# Patient Record
Sex: Female | Born: 1945
Health system: Southern US, Community
[De-identification: ages and names within clinical notes are randomized; demographics above are authoritative.]

## PROBLEM LIST (undated history)

## (undated) DIAGNOSIS — I4719 Other supraventricular tachycardia: Secondary | ICD-10-CM

## (undated) DIAGNOSIS — R002 Palpitations: Secondary | ICD-10-CM

## (undated) DIAGNOSIS — I10 Essential (primary) hypertension: Secondary | ICD-10-CM

## (undated) DIAGNOSIS — I471 Supraventricular tachycardia, unspecified: Secondary | ICD-10-CM

## (undated) DIAGNOSIS — E7800 Pure hypercholesterolemia, unspecified: Secondary | ICD-10-CM

## (undated) HISTORY — PX: TUBAL LIGATION: SHX77

## (undated) HISTORY — DX: Palpitations: R00.2

## (undated) HISTORY — PX: TONSILLECTOMY: SUR1361

## (undated) HISTORY — DX: Supraventricular tachycardia, unspecified: I47.10

## (undated) HISTORY — DX: Other supraventricular tachycardia: I47.19

## (undated) HISTORY — DX: Pure hypercholesterolemia, unspecified: E78.00

---

## 1997-08-22 ENCOUNTER — Other Ambulatory Visit: Admission: RE | Admit: 1997-08-22 | Discharge: 1997-08-22 | Payer: Self-pay | Admitting: Obstetrics & Gynecology

## 1999-10-20 ENCOUNTER — Other Ambulatory Visit: Admission: RE | Admit: 1999-10-20 | Discharge: 1999-10-20 | Payer: Self-pay | Admitting: Obstetrics & Gynecology

## 2002-03-04 ENCOUNTER — Other Ambulatory Visit: Admission: RE | Admit: 2002-03-04 | Discharge: 2002-03-04 | Payer: Self-pay | Admitting: Obstetrics & Gynecology

## 2003-11-06 ENCOUNTER — Other Ambulatory Visit: Admission: RE | Admit: 2003-11-06 | Discharge: 2003-11-06 | Payer: Self-pay | Admitting: Obstetrics & Gynecology

## 2005-03-21 ENCOUNTER — Other Ambulatory Visit: Admission: RE | Admit: 2005-03-21 | Discharge: 2005-03-21 | Payer: Self-pay | Admitting: Emergency Medicine

## 2006-05-04 ENCOUNTER — Other Ambulatory Visit: Admission: RE | Admit: 2006-05-04 | Discharge: 2006-05-04 | Payer: Self-pay | Admitting: Emergency Medicine

## 2010-02-07 ENCOUNTER — Encounter: Payer: Self-pay | Admitting: Emergency Medicine

## 2011-01-04 ENCOUNTER — Ambulatory Visit: Payer: Self-pay

## 2012-05-18 DIAGNOSIS — R209 Unspecified disturbances of skin sensation: Secondary | ICD-10-CM | POA: Diagnosis not present

## 2012-05-18 DIAGNOSIS — M999 Biomechanical lesion, unspecified: Secondary | ICD-10-CM | POA: Diagnosis not present

## 2012-05-18 DIAGNOSIS — M9981 Other biomechanical lesions of cervical region: Secondary | ICD-10-CM | POA: Diagnosis not present

## 2012-05-18 DIAGNOSIS — M531 Cervicobrachial syndrome: Secondary | ICD-10-CM | POA: Diagnosis not present

## 2012-06-05 DIAGNOSIS — M999 Biomechanical lesion, unspecified: Secondary | ICD-10-CM | POA: Diagnosis not present

## 2012-06-05 DIAGNOSIS — M9981 Other biomechanical lesions of cervical region: Secondary | ICD-10-CM | POA: Diagnosis not present

## 2012-06-05 DIAGNOSIS — M531 Cervicobrachial syndrome: Secondary | ICD-10-CM | POA: Diagnosis not present

## 2012-06-05 DIAGNOSIS — R209 Unspecified disturbances of skin sensation: Secondary | ICD-10-CM | POA: Diagnosis not present

## 2012-07-09 DIAGNOSIS — M999 Biomechanical lesion, unspecified: Secondary | ICD-10-CM | POA: Diagnosis not present

## 2012-07-09 DIAGNOSIS — R209 Unspecified disturbances of skin sensation: Secondary | ICD-10-CM | POA: Diagnosis not present

## 2012-07-09 DIAGNOSIS — M531 Cervicobrachial syndrome: Secondary | ICD-10-CM | POA: Diagnosis not present

## 2012-07-09 DIAGNOSIS — M9981 Other biomechanical lesions of cervical region: Secondary | ICD-10-CM | POA: Diagnosis not present

## 2012-08-07 DIAGNOSIS — M999 Biomechanical lesion, unspecified: Secondary | ICD-10-CM | POA: Diagnosis not present

## 2012-08-07 DIAGNOSIS — R209 Unspecified disturbances of skin sensation: Secondary | ICD-10-CM | POA: Diagnosis not present

## 2012-08-07 DIAGNOSIS — M531 Cervicobrachial syndrome: Secondary | ICD-10-CM | POA: Diagnosis not present

## 2012-08-07 DIAGNOSIS — M9981 Other biomechanical lesions of cervical region: Secondary | ICD-10-CM | POA: Diagnosis not present

## 2012-09-07 DIAGNOSIS — R209 Unspecified disturbances of skin sensation: Secondary | ICD-10-CM | POA: Diagnosis not present

## 2012-09-07 DIAGNOSIS — M9981 Other biomechanical lesions of cervical region: Secondary | ICD-10-CM | POA: Diagnosis not present

## 2012-09-07 DIAGNOSIS — M999 Biomechanical lesion, unspecified: Secondary | ICD-10-CM | POA: Diagnosis not present

## 2012-09-07 DIAGNOSIS — M531 Cervicobrachial syndrome: Secondary | ICD-10-CM | POA: Diagnosis not present

## 2012-09-24 DIAGNOSIS — M999 Biomechanical lesion, unspecified: Secondary | ICD-10-CM | POA: Diagnosis not present

## 2012-09-24 DIAGNOSIS — R209 Unspecified disturbances of skin sensation: Secondary | ICD-10-CM | POA: Diagnosis not present

## 2012-09-24 DIAGNOSIS — M9981 Other biomechanical lesions of cervical region: Secondary | ICD-10-CM | POA: Diagnosis not present

## 2012-09-24 DIAGNOSIS — M531 Cervicobrachial syndrome: Secondary | ICD-10-CM | POA: Diagnosis not present

## 2012-10-17 DIAGNOSIS — L821 Other seborrheic keratosis: Secondary | ICD-10-CM | POA: Diagnosis not present

## 2012-10-17 DIAGNOSIS — Z23 Encounter for immunization: Secondary | ICD-10-CM | POA: Diagnosis not present

## 2012-10-17 DIAGNOSIS — H579 Unspecified disorder of eye and adnexa: Secondary | ICD-10-CM | POA: Diagnosis not present

## 2012-10-25 DIAGNOSIS — M999 Biomechanical lesion, unspecified: Secondary | ICD-10-CM | POA: Diagnosis not present

## 2012-10-25 DIAGNOSIS — M531 Cervicobrachial syndrome: Secondary | ICD-10-CM | POA: Diagnosis not present

## 2012-10-25 DIAGNOSIS — R209 Unspecified disturbances of skin sensation: Secondary | ICD-10-CM | POA: Diagnosis not present

## 2012-10-25 DIAGNOSIS — M9981 Other biomechanical lesions of cervical region: Secondary | ICD-10-CM | POA: Diagnosis not present

## 2012-12-18 DIAGNOSIS — R209 Unspecified disturbances of skin sensation: Secondary | ICD-10-CM | POA: Diagnosis not present

## 2012-12-18 DIAGNOSIS — M999 Biomechanical lesion, unspecified: Secondary | ICD-10-CM | POA: Diagnosis not present

## 2012-12-18 DIAGNOSIS — M9981 Other biomechanical lesions of cervical region: Secondary | ICD-10-CM | POA: Diagnosis not present

## 2012-12-18 DIAGNOSIS — M531 Cervicobrachial syndrome: Secondary | ICD-10-CM | POA: Diagnosis not present

## 2012-12-20 DIAGNOSIS — M9981 Other biomechanical lesions of cervical region: Secondary | ICD-10-CM | POA: Diagnosis not present

## 2012-12-20 DIAGNOSIS — M531 Cervicobrachial syndrome: Secondary | ICD-10-CM | POA: Diagnosis not present

## 2012-12-20 DIAGNOSIS — M999 Biomechanical lesion, unspecified: Secondary | ICD-10-CM | POA: Diagnosis not present

## 2012-12-20 DIAGNOSIS — R209 Unspecified disturbances of skin sensation: Secondary | ICD-10-CM | POA: Diagnosis not present

## 2012-12-27 DIAGNOSIS — M999 Biomechanical lesion, unspecified: Secondary | ICD-10-CM | POA: Diagnosis not present

## 2012-12-27 DIAGNOSIS — M531 Cervicobrachial syndrome: Secondary | ICD-10-CM | POA: Diagnosis not present

## 2012-12-27 DIAGNOSIS — R209 Unspecified disturbances of skin sensation: Secondary | ICD-10-CM | POA: Diagnosis not present

## 2012-12-27 DIAGNOSIS — M9981 Other biomechanical lesions of cervical region: Secondary | ICD-10-CM | POA: Diagnosis not present

## 2013-01-04 ENCOUNTER — Encounter: Payer: Self-pay | Admitting: Cardiovascular Disease

## 2013-01-04 ENCOUNTER — Ambulatory Visit (INDEPENDENT_AMBULATORY_CARE_PROVIDER_SITE_OTHER): Payer: Medicare Other | Admitting: Cardiovascular Disease

## 2013-01-04 VITALS — BP 130/90 | HR 66 | Ht 61.5 in | Wt 128.0 lb

## 2013-01-04 DIAGNOSIS — R002 Palpitations: Secondary | ICD-10-CM | POA: Diagnosis not present

## 2013-01-04 NOTE — Assessment & Plan Note (Signed)
Patient was a long-time patient of Dr. Caprice Kluver. He saw her for palpitations. She had a negative Myoview back in 2005. She gets palpitations during stress and with chocolate and caffeine. She lost her husband of 46 years this past year and has had more palpitations than usual. She denies chest pain or shortness of breath

## 2013-01-04 NOTE — Progress Notes (Signed)
     01/04/2013 Kristen Merritt   November 25, 1945  [id number]  Primary Physician Kristen Bussing, MD Primary Cardiologist: Runell Gess MD [name]   HPI:  Kristen Merritt is a playful 66 year old thin appearing recently widowed Caucasian female (husband of 46 years died within the last year), mother of 3 (accompanied by one of her daughters today), grandmother 4 grandchildren who was formerly a patient of Dr. Mindi Junker [facility]. She has a history of palpitations exacerbated by ingestion of caffeine and/or chocolate. He is also  On exacerbated by stress. With the recent loss of her husband of 46 years she's been under excessive stress and has noticed no palpitations. She denies chest pain or shortness of breath. She's had acceptable lipid profile in the past which is followed by her primary care physician. She denies chest pain or shortness of breath.   Current Outpatient Prescriptions  Medication Sig Dispense Refill  . atenolol (TENORMIN) 25 MG tablet Take 25 mg by mouth daily.       No current facility-administered medications for this visit.    Allergies  Allergen Reactions  . Lodine [Etodolac]     rash  . Mavik [Trandolapril]   . Sulfa Antibiotics     Rash     History   Social History  . Marital Status: Single    Spouse Name: N/A    Number of Children: N/A  . Years of Education: N/A   Occupational History  . Not on file.   Social History Main Topics  . Smoking status: Never Smoker   . Smokeless tobacco: Not on file  . Alcohol Use: Not on file  . Drug Use: Not on file  . Sexual Activity: Not on file   Other Topics Concern  . Not on file   Social History Narrative  . No narrative on file     Review of Systems: General: negative for chills, fever, night sweats or weight changes.  Cardiovascular: negative for chest pain, dyspnea on exertion, edema, orthopnea, palpitations, paroxysmal nocturnal dyspnea or shortness of breath Dermatological: negative for  rash Respiratory: negative for cough or wheezing Urologic: negative for hematuria Abdominal: negative for nausea, vomiting, diarrhea, bright red blood per rectum, melena, or hematemesis Neurologic: negative for visual changes, syncope, or dizziness All other systems reviewed and are otherwise negative except as noted above.    Blood pressure 130/70, pulse 66, height 5' 1.5" (1.562 m), weight 128 lb (58.06 kg).  General appearance: alert and no distress Neck: no adenopathy, no carotid bruit, no JVD, supple, symmetrical, trachea midline and thyroid not enlarged, symmetric, no tenderness/mass/nodules Lungs: clear to auscultation bilaterally Heart: regular rate and rhythm, S1, S2 normal, no murmur, click, rub or gallop Abdomen: soft, non-tender; bowel sounds normal; no masses,  no organomegaly Extremities: extremities normal, atraumatic, no cyanosis or edema Pulses: 2+ and symmetric  EKG sinus rhythm at 66 without ST or T wave changes  ASSESSMENT AND PLAN:   Palpitations Patient was a long-time patient of Dr. Caprice Kluver. He saw her for palpitations. She had a negative Myoview back in 2005. She gets palpitations during stress and with chocolate and caffeine. She lost her husband of 46 years this past year and has had more palpitations than usual. She denies chest pain or shortness of breath      Runell Gess MD [facility], [facility] 01/04/2013 9:14 AM

## 2013-01-04 NOTE — Patient Instructions (Signed)
Follow up with Dr Berry as needed.  

## 2013-01-31 DIAGNOSIS — M531 Cervicobrachial syndrome: Secondary | ICD-10-CM | POA: Diagnosis not present

## 2013-01-31 DIAGNOSIS — R209 Unspecified disturbances of skin sensation: Secondary | ICD-10-CM | POA: Diagnosis not present

## 2013-01-31 DIAGNOSIS — M999 Biomechanical lesion, unspecified: Secondary | ICD-10-CM | POA: Diagnosis not present

## 2013-01-31 DIAGNOSIS — M9981 Other biomechanical lesions of cervical region: Secondary | ICD-10-CM | POA: Diagnosis not present

## 2013-01-31 DIAGNOSIS — H6120 Impacted cerumen, unspecified ear: Secondary | ICD-10-CM | POA: Diagnosis not present

## 2013-03-01 DIAGNOSIS — R209 Unspecified disturbances of skin sensation: Secondary | ICD-10-CM | POA: Diagnosis not present

## 2013-03-01 DIAGNOSIS — M999 Biomechanical lesion, unspecified: Secondary | ICD-10-CM | POA: Diagnosis not present

## 2013-03-01 DIAGNOSIS — M531 Cervicobrachial syndrome: Secondary | ICD-10-CM | POA: Diagnosis not present

## 2013-03-01 DIAGNOSIS — M9981 Other biomechanical lesions of cervical region: Secondary | ICD-10-CM | POA: Diagnosis not present

## 2013-03-22 DIAGNOSIS — M531 Cervicobrachial syndrome: Secondary | ICD-10-CM | POA: Diagnosis not present

## 2013-03-22 DIAGNOSIS — M9981 Other biomechanical lesions of cervical region: Secondary | ICD-10-CM | POA: Diagnosis not present

## 2013-03-22 DIAGNOSIS — R209 Unspecified disturbances of skin sensation: Secondary | ICD-10-CM | POA: Diagnosis not present

## 2013-03-22 DIAGNOSIS — M999 Biomechanical lesion, unspecified: Secondary | ICD-10-CM | POA: Diagnosis not present

## 2013-04-04 DIAGNOSIS — I471 Supraventricular tachycardia: Secondary | ICD-10-CM | POA: Diagnosis not present

## 2013-04-04 DIAGNOSIS — Z136 Encounter for screening for cardiovascular disorders: Secondary | ICD-10-CM | POA: Diagnosis not present

## 2013-04-04 DIAGNOSIS — Z131 Encounter for screening for diabetes mellitus: Secondary | ICD-10-CM | POA: Diagnosis not present

## 2013-04-04 DIAGNOSIS — I10 Essential (primary) hypertension: Secondary | ICD-10-CM | POA: Diagnosis not present

## 2013-04-04 DIAGNOSIS — Z0000 Encounter for general adult medical examination without abnormal findings: Secondary | ICD-10-CM | POA: Diagnosis not present

## 2013-04-25 DIAGNOSIS — L259 Unspecified contact dermatitis, unspecified cause: Secondary | ICD-10-CM | POA: Diagnosis not present

## 2013-04-25 DIAGNOSIS — L570 Actinic keratosis: Secondary | ICD-10-CM | POA: Diagnosis not present

## 2013-04-25 DIAGNOSIS — B351 Tinea unguium: Secondary | ICD-10-CM | POA: Diagnosis not present

## 2013-05-01 DIAGNOSIS — M9981 Other biomechanical lesions of cervical region: Secondary | ICD-10-CM | POA: Diagnosis not present

## 2013-05-01 DIAGNOSIS — R209 Unspecified disturbances of skin sensation: Secondary | ICD-10-CM | POA: Diagnosis not present

## 2013-05-01 DIAGNOSIS — M999 Biomechanical lesion, unspecified: Secondary | ICD-10-CM | POA: Diagnosis not present

## 2013-05-01 DIAGNOSIS — M531 Cervicobrachial syndrome: Secondary | ICD-10-CM | POA: Diagnosis not present

## 2013-06-06 DIAGNOSIS — M999 Biomechanical lesion, unspecified: Secondary | ICD-10-CM | POA: Diagnosis not present

## 2013-06-06 DIAGNOSIS — R209 Unspecified disturbances of skin sensation: Secondary | ICD-10-CM | POA: Diagnosis not present

## 2013-06-06 DIAGNOSIS — M9981 Other biomechanical lesions of cervical region: Secondary | ICD-10-CM | POA: Diagnosis not present

## 2013-06-06 DIAGNOSIS — M531 Cervicobrachial syndrome: Secondary | ICD-10-CM | POA: Diagnosis not present

## 2013-07-02 DIAGNOSIS — R209 Unspecified disturbances of skin sensation: Secondary | ICD-10-CM | POA: Diagnosis not present

## 2013-07-02 DIAGNOSIS — M531 Cervicobrachial syndrome: Secondary | ICD-10-CM | POA: Diagnosis not present

## 2013-07-02 DIAGNOSIS — M999 Biomechanical lesion, unspecified: Secondary | ICD-10-CM | POA: Diagnosis not present

## 2013-07-02 DIAGNOSIS — M9981 Other biomechanical lesions of cervical region: Secondary | ICD-10-CM | POA: Diagnosis not present

## 2013-07-05 DIAGNOSIS — M531 Cervicobrachial syndrome: Secondary | ICD-10-CM | POA: Diagnosis not present

## 2013-07-05 DIAGNOSIS — M999 Biomechanical lesion, unspecified: Secondary | ICD-10-CM | POA: Diagnosis not present

## 2013-07-05 DIAGNOSIS — M9981 Other biomechanical lesions of cervical region: Secondary | ICD-10-CM | POA: Diagnosis not present

## 2013-07-05 DIAGNOSIS — R209 Unspecified disturbances of skin sensation: Secondary | ICD-10-CM | POA: Diagnosis not present

## 2013-08-22 DIAGNOSIS — M999 Biomechanical lesion, unspecified: Secondary | ICD-10-CM | POA: Diagnosis not present

## 2013-08-22 DIAGNOSIS — M531 Cervicobrachial syndrome: Secondary | ICD-10-CM | POA: Diagnosis not present

## 2013-08-22 DIAGNOSIS — M9981 Other biomechanical lesions of cervical region: Secondary | ICD-10-CM | POA: Diagnosis not present

## 2013-08-22 DIAGNOSIS — R209 Unspecified disturbances of skin sensation: Secondary | ICD-10-CM | POA: Diagnosis not present

## 2013-10-06 DIAGNOSIS — Z23 Encounter for immunization: Secondary | ICD-10-CM | POA: Diagnosis not present

## 2013-10-29 DIAGNOSIS — M5441 Lumbago with sciatica, right side: Secondary | ICD-10-CM | POA: Diagnosis not present

## 2013-10-29 DIAGNOSIS — M9901 Segmental and somatic dysfunction of cervical region: Secondary | ICD-10-CM | POA: Diagnosis not present

## 2013-10-29 DIAGNOSIS — M9903 Segmental and somatic dysfunction of lumbar region: Secondary | ICD-10-CM | POA: Diagnosis not present

## 2013-10-29 DIAGNOSIS — M5136 Other intervertebral disc degeneration, lumbar region: Secondary | ICD-10-CM | POA: Diagnosis not present

## 2014-01-08 DIAGNOSIS — M5441 Lumbago with sciatica, right side: Secondary | ICD-10-CM | POA: Diagnosis not present

## 2014-01-08 DIAGNOSIS — M9901 Segmental and somatic dysfunction of cervical region: Secondary | ICD-10-CM | POA: Diagnosis not present

## 2014-01-08 DIAGNOSIS — M9903 Segmental and somatic dysfunction of lumbar region: Secondary | ICD-10-CM | POA: Diagnosis not present

## 2014-01-08 DIAGNOSIS — M5136 Other intervertebral disc degeneration, lumbar region: Secondary | ICD-10-CM | POA: Diagnosis not present

## 2014-01-22 ENCOUNTER — Ambulatory Visit: Payer: PRIVATE HEALTH INSURANCE | Admitting: Cardiovascular Disease

## 2014-02-20 DIAGNOSIS — H6121 Impacted cerumen, right ear: Secondary | ICD-10-CM | POA: Diagnosis not present

## 2014-02-20 DIAGNOSIS — H6061 Unspecified chronic otitis externa, right ear: Secondary | ICD-10-CM | POA: Diagnosis not present

## 2014-02-21 DIAGNOSIS — M5136 Other intervertebral disc degeneration, lumbar region: Secondary | ICD-10-CM | POA: Diagnosis not present

## 2014-02-21 DIAGNOSIS — M9901 Segmental and somatic dysfunction of cervical region: Secondary | ICD-10-CM | POA: Diagnosis not present

## 2014-02-21 DIAGNOSIS — M9903 Segmental and somatic dysfunction of lumbar region: Secondary | ICD-10-CM | POA: Diagnosis not present

## 2014-02-21 DIAGNOSIS — M5441 Lumbago with sciatica, right side: Secondary | ICD-10-CM | POA: Diagnosis not present

## 2014-04-07 DIAGNOSIS — M5136 Other intervertebral disc degeneration, lumbar region: Secondary | ICD-10-CM | POA: Diagnosis not present

## 2014-04-07 DIAGNOSIS — M9901 Segmental and somatic dysfunction of cervical region: Secondary | ICD-10-CM | POA: Diagnosis not present

## 2014-04-07 DIAGNOSIS — M5441 Lumbago with sciatica, right side: Secondary | ICD-10-CM | POA: Diagnosis not present

## 2014-04-07 DIAGNOSIS — M9903 Segmental and somatic dysfunction of lumbar region: Secondary | ICD-10-CM | POA: Diagnosis not present

## 2014-04-24 DIAGNOSIS — Z0000 Encounter for general adult medical examination without abnormal findings: Secondary | ICD-10-CM | POA: Diagnosis not present

## 2014-04-24 DIAGNOSIS — I471 Supraventricular tachycardia: Secondary | ICD-10-CM | POA: Diagnosis not present

## 2014-04-24 DIAGNOSIS — Z23 Encounter for immunization: Secondary | ICD-10-CM | POA: Diagnosis not present

## 2014-04-24 DIAGNOSIS — Z136 Encounter for screening for cardiovascular disorders: Secondary | ICD-10-CM | POA: Diagnosis not present

## 2014-04-24 DIAGNOSIS — Z131 Encounter for screening for diabetes mellitus: Secondary | ICD-10-CM | POA: Diagnosis not present

## 2014-04-30 DIAGNOSIS — M5136 Other intervertebral disc degeneration, lumbar region: Secondary | ICD-10-CM | POA: Diagnosis not present

## 2014-04-30 DIAGNOSIS — M9903 Segmental and somatic dysfunction of lumbar region: Secondary | ICD-10-CM | POA: Diagnosis not present

## 2014-04-30 DIAGNOSIS — M5441 Lumbago with sciatica, right side: Secondary | ICD-10-CM | POA: Diagnosis not present

## 2014-04-30 DIAGNOSIS — M9901 Segmental and somatic dysfunction of cervical region: Secondary | ICD-10-CM | POA: Diagnosis not present

## 2014-07-31 DIAGNOSIS — Z1231 Encounter for screening mammogram for malignant neoplasm of breast: Secondary | ICD-10-CM | POA: Diagnosis not present

## 2014-07-31 DIAGNOSIS — Z01419 Encounter for gynecological examination (general) (routine) without abnormal findings: Secondary | ICD-10-CM | POA: Diagnosis not present

## 2014-07-31 DIAGNOSIS — Z124 Encounter for screening for malignant neoplasm of cervix: Secondary | ICD-10-CM | POA: Diagnosis not present

## 2014-07-31 DIAGNOSIS — Z6823 Body mass index (BMI) 23.0-23.9, adult: Secondary | ICD-10-CM | POA: Diagnosis not present

## 2014-08-06 ENCOUNTER — Other Ambulatory Visit: Payer: Self-pay | Admitting: Obstetrics & Gynecology

## 2014-08-06 DIAGNOSIS — R928 Other abnormal and inconclusive findings on diagnostic imaging of breast: Secondary | ICD-10-CM

## 2014-08-11 ENCOUNTER — Ambulatory Visit
Admission: RE | Admit: 2014-08-11 | Discharge: 2014-08-11 | Disposition: A | Discharge status: Home / Self Care | Payer: Medicare Other | Source: Ambulatory Visit | Attending: Obstetrics & Gynecology | Admitting: Obstetrics & Gynecology

## 2014-08-11 DIAGNOSIS — R928 Other abnormal and inconclusive findings on diagnostic imaging of breast: Secondary | ICD-10-CM

## 2014-08-22 ENCOUNTER — Ambulatory Visit (INDEPENDENT_AMBULATORY_CARE_PROVIDER_SITE_OTHER): Payer: Medicare Other | Admitting: Cardiovascular Disease

## 2014-08-22 ENCOUNTER — Encounter: Payer: Self-pay | Admitting: Cardiovascular Disease

## 2014-08-22 VITALS — BP 145/83 | HR 69 | Ht 61.5 in | Wt 125.3 lb

## 2014-08-22 DIAGNOSIS — R002 Palpitations: Secondary | ICD-10-CM | POA: Diagnosis not present

## 2014-08-22 NOTE — Progress Notes (Signed)
     08/22/2014 Colin Benton   03-30-1945  [id number]  Primary Physician Lujean Amel, MD Primary Cardiologist: Lorretta Harp MD Renae Gloss   HPI:   Ms. Kristen Merritt is a delightful 69 year old thin appearing recently widowed Caucasian female (husband of 20 years died within the last year), mother of 3 (accompanied by one of her daughters  Kristen Merritt today), grandmother 4 grandchildren who was formerly a patient of Dr. Durwin Kristen Merritt's. She has a history of palpitations exacerbated by ingestion of caffeine and/or chocolate. It is also exacerbated by stress. With the recent loss of her husband of 46 years she's been under excessive stress and has noticed increased palpitations. She denies chest pain or shortness of breath. She's had acceptable lipid profile in the past which is followed by her primary care physician. She says since I saw her year ago for palpitations become more pronounced associated with occasional cough.   Current Outpatient Prescriptions  Medication Sig Dispense Refill  . atenolol (TENORMIN) 25 MG tablet Take 25 mg by mouth daily.     No current facility-administered medications for this visit.    Allergies  Allergen Reactions  . Lodine [Etodolac]     rash  . Mavik [Trandolapril]   . Sulfa Antibiotics     Rash     History   Social History  . Marital Status: Single    Spouse Name: N/A  . Number of Children: N/A  . Years of Education: N/A   Occupational History  . Not on file.   Social History Main Topics  . Smoking status: Never Smoker   . Smokeless tobacco: Not on file  . Alcohol Use: Not on file  . Drug Use: Not on file  . Sexual Activity: Not on file   Other Topics Concern  . Not on file   Social History Narrative     Review of Systems: General: negative for chills, fever, night sweats or weight changes.  Cardiovascular: negative for chest pain, dyspnea on exertion, edema, orthopnea, palpitations, paroxysmal nocturnal dyspnea or shortness of  breath Dermatological: negative for rash Respiratory: negative for cough or wheezing Urologic: negative for hematuria Abdominal: negative for nausea, vomiting, diarrhea, bright red blood per rectum, melena, or hematemesis Neurologic: negative for visual changes, syncope, or dizziness All other systems reviewed and are otherwise negative except as noted above.    Blood pressure 145/83, pulse 69, height 5' 1.5" (1.562 m), weight 125 lb 4.8 oz (56.836 kg).  General appearance: alert and no distress Neck: no adenopathy, no carotid bruit, no JVD, supple, symmetrical, trachea midline and thyroid not enlarged, symmetric, no tenderness/mass/nodules Lungs: clear to auscultation bilaterally Heart: regular rate and rhythm, S1, S2 normal, no murmur, click, rub or gallop Extremities: extremities normal, atraumatic, no cyanosis or edema  EKG normal sinus rhythm at 66 without ST or T-wave changes. I personally reviewed this EKG  ASSESSMENT AND PLAN:   Palpitations [name] has had chronic palpitations related to PVCs controlled for the most part on low-dose beta blocker. She thinks her palpitations are more pronounced. She is limited caffeine. Her diet. She is otherwise asymptomatic heart rate in the 60s. I'm hesitant to increase her beta blocker because of her already low heart rate. I reassured her that her PVCs are probably not changed and recommend continued low-dose beta blocker.      Lorretta Harp MD FACP,FACC,FAHA, [facility] 08/22/2014 2:31 PM

## 2014-08-22 NOTE — Assessment & Plan Note (Signed)
Kristen Merritt has had chronic palpitations related to PVCs controlled for the most part on low-dose beta blocker. She thinks her palpitations are more pronounced. She is limited caffeine. Her diet. She is otherwise asymptomatic heart rate in the 60s. I'm hesitant to increase her beta blocker because of her already low heart rate. I reassured her that her PVCs are probably not changed and recommend continued low-dose beta blocker.

## 2014-08-22 NOTE — Patient Instructions (Signed)
Dr Berry recommends that you schedule a follow-up appointment in 1 year. You will receive a reminder letter in the mail two months in advance. If you don't receive a letter, please call our office to schedule the follow-up appointment. 

## 2014-09-18 DIAGNOSIS — H35373 Puckering of macula, bilateral: Secondary | ICD-10-CM | POA: Diagnosis not present

## 2014-09-18 DIAGNOSIS — H52203 Unspecified astigmatism, bilateral: Secondary | ICD-10-CM | POA: Diagnosis not present

## 2014-09-23 DIAGNOSIS — M9903 Segmental and somatic dysfunction of lumbar region: Secondary | ICD-10-CM | POA: Diagnosis not present

## 2014-09-23 DIAGNOSIS — M9901 Segmental and somatic dysfunction of cervical region: Secondary | ICD-10-CM | POA: Diagnosis not present

## 2014-09-23 DIAGNOSIS — M5441 Lumbago with sciatica, right side: Secondary | ICD-10-CM | POA: Diagnosis not present

## 2014-09-23 DIAGNOSIS — M5136 Other intervertebral disc degeneration, lumbar region: Secondary | ICD-10-CM | POA: Diagnosis not present

## 2014-09-24 ENCOUNTER — Ambulatory Visit: Payer: Medicare Other | Admitting: Cardiovascular Disease

## 2014-09-25 DIAGNOSIS — M9904 Segmental and somatic dysfunction of sacral region: Secondary | ICD-10-CM | POA: Diagnosis not present

## 2014-09-25 DIAGNOSIS — M5136 Other intervertebral disc degeneration, lumbar region: Secondary | ICD-10-CM | POA: Diagnosis not present

## 2014-09-25 DIAGNOSIS — M9905 Segmental and somatic dysfunction of pelvic region: Secondary | ICD-10-CM | POA: Diagnosis not present

## 2014-09-25 DIAGNOSIS — M9903 Segmental and somatic dysfunction of lumbar region: Secondary | ICD-10-CM | POA: Diagnosis not present

## 2014-10-03 DIAGNOSIS — M9905 Segmental and somatic dysfunction of pelvic region: Secondary | ICD-10-CM | POA: Diagnosis not present

## 2014-10-03 DIAGNOSIS — M9903 Segmental and somatic dysfunction of lumbar region: Secondary | ICD-10-CM | POA: Diagnosis not present

## 2014-10-03 DIAGNOSIS — M5136 Other intervertebral disc degeneration, lumbar region: Secondary | ICD-10-CM | POA: Diagnosis not present

## 2014-10-03 DIAGNOSIS — M9904 Segmental and somatic dysfunction of sacral region: Secondary | ICD-10-CM | POA: Diagnosis not present

## 2014-10-03 DIAGNOSIS — Z23 Encounter for immunization: Secondary | ICD-10-CM | POA: Diagnosis not present

## 2014-11-18 DIAGNOSIS — M9905 Segmental and somatic dysfunction of pelvic region: Secondary | ICD-10-CM | POA: Diagnosis not present

## 2014-11-18 DIAGNOSIS — M9903 Segmental and somatic dysfunction of lumbar region: Secondary | ICD-10-CM | POA: Diagnosis not present

## 2014-11-18 DIAGNOSIS — M9904 Segmental and somatic dysfunction of sacral region: Secondary | ICD-10-CM | POA: Diagnosis not present

## 2014-11-18 DIAGNOSIS — M5136 Other intervertebral disc degeneration, lumbar region: Secondary | ICD-10-CM | POA: Diagnosis not present

## 2014-12-05 DIAGNOSIS — M9904 Segmental and somatic dysfunction of sacral region: Secondary | ICD-10-CM | POA: Diagnosis not present

## 2014-12-05 DIAGNOSIS — M9903 Segmental and somatic dysfunction of lumbar region: Secondary | ICD-10-CM | POA: Diagnosis not present

## 2014-12-05 DIAGNOSIS — M5136 Other intervertebral disc degeneration, lumbar region: Secondary | ICD-10-CM | POA: Diagnosis not present

## 2014-12-05 DIAGNOSIS — M9905 Segmental and somatic dysfunction of pelvic region: Secondary | ICD-10-CM | POA: Diagnosis not present

## 2014-12-17 DIAGNOSIS — H35372 Puckering of macula, left eye: Secondary | ICD-10-CM | POA: Diagnosis not present

## 2014-12-17 DIAGNOSIS — H35363 Drusen (degenerative) of macula, bilateral: Secondary | ICD-10-CM | POA: Diagnosis not present

## 2014-12-17 DIAGNOSIS — H353131 Nonexudative age-related macular degeneration, bilateral, early dry stage: Secondary | ICD-10-CM | POA: Diagnosis not present

## 2014-12-17 DIAGNOSIS — H35371 Puckering of macula, right eye: Secondary | ICD-10-CM | POA: Diagnosis not present

## 2014-12-17 DIAGNOSIS — H25813 Combined forms of age-related cataract, bilateral: Secondary | ICD-10-CM | POA: Diagnosis not present

## 2015-01-19 DIAGNOSIS — M5136 Other intervertebral disc degeneration, lumbar region: Secondary | ICD-10-CM | POA: Diagnosis not present

## 2015-01-19 DIAGNOSIS — M9904 Segmental and somatic dysfunction of sacral region: Secondary | ICD-10-CM | POA: Diagnosis not present

## 2015-01-19 DIAGNOSIS — M9903 Segmental and somatic dysfunction of lumbar region: Secondary | ICD-10-CM | POA: Diagnosis not present

## 2015-01-19 DIAGNOSIS — M9905 Segmental and somatic dysfunction of pelvic region: Secondary | ICD-10-CM | POA: Diagnosis not present

## 2015-01-30 DIAGNOSIS — M9904 Segmental and somatic dysfunction of sacral region: Secondary | ICD-10-CM | POA: Diagnosis not present

## 2015-01-30 DIAGNOSIS — M9905 Segmental and somatic dysfunction of pelvic region: Secondary | ICD-10-CM | POA: Diagnosis not present

## 2015-01-30 DIAGNOSIS — M9903 Segmental and somatic dysfunction of lumbar region: Secondary | ICD-10-CM | POA: Diagnosis not present

## 2015-01-30 DIAGNOSIS — M5136 Other intervertebral disc degeneration, lumbar region: Secondary | ICD-10-CM | POA: Diagnosis not present

## 2015-03-18 DIAGNOSIS — M5136 Other intervertebral disc degeneration, lumbar region: Secondary | ICD-10-CM | POA: Diagnosis not present

## 2015-03-18 DIAGNOSIS — M9905 Segmental and somatic dysfunction of pelvic region: Secondary | ICD-10-CM | POA: Diagnosis not present

## 2015-03-18 DIAGNOSIS — M9903 Segmental and somatic dysfunction of lumbar region: Secondary | ICD-10-CM | POA: Diagnosis not present

## 2015-03-18 DIAGNOSIS — M9904 Segmental and somatic dysfunction of sacral region: Secondary | ICD-10-CM | POA: Diagnosis not present

## 2015-03-20 DIAGNOSIS — M5136 Other intervertebral disc degeneration, lumbar region: Secondary | ICD-10-CM | POA: Diagnosis not present

## 2015-03-20 DIAGNOSIS — M9903 Segmental and somatic dysfunction of lumbar region: Secondary | ICD-10-CM | POA: Diagnosis not present

## 2015-03-20 DIAGNOSIS — M9904 Segmental and somatic dysfunction of sacral region: Secondary | ICD-10-CM | POA: Diagnosis not present

## 2015-03-20 DIAGNOSIS — M9905 Segmental and somatic dysfunction of pelvic region: Secondary | ICD-10-CM | POA: Diagnosis not present

## 2015-04-28 DIAGNOSIS — M9902 Segmental and somatic dysfunction of thoracic region: Secondary | ICD-10-CM | POA: Diagnosis not present

## 2015-04-28 DIAGNOSIS — M5136 Other intervertebral disc degeneration, lumbar region: Secondary | ICD-10-CM | POA: Diagnosis not present

## 2015-04-28 DIAGNOSIS — M9903 Segmental and somatic dysfunction of lumbar region: Secondary | ICD-10-CM | POA: Diagnosis not present

## 2015-04-28 DIAGNOSIS — M9904 Segmental and somatic dysfunction of sacral region: Secondary | ICD-10-CM | POA: Diagnosis not present

## 2015-04-29 DIAGNOSIS — Z0000 Encounter for general adult medical examination without abnormal findings: Secondary | ICD-10-CM | POA: Diagnosis not present

## 2015-04-29 DIAGNOSIS — Z131 Encounter for screening for diabetes mellitus: Secondary | ICD-10-CM | POA: Diagnosis not present

## 2015-04-29 DIAGNOSIS — E7800 Pure hypercholesterolemia, unspecified: Secondary | ICD-10-CM | POA: Diagnosis not present

## 2015-04-29 DIAGNOSIS — I471 Supraventricular tachycardia: Secondary | ICD-10-CM | POA: Diagnosis not present

## 2015-05-07 DIAGNOSIS — H6121 Impacted cerumen, right ear: Secondary | ICD-10-CM | POA: Diagnosis not present

## 2015-05-07 DIAGNOSIS — H6061 Unspecified chronic otitis externa, right ear: Secondary | ICD-10-CM | POA: Diagnosis not present

## 2015-05-12 DIAGNOSIS — D2261 Melanocytic nevi of right upper limb, including shoulder: Secondary | ICD-10-CM | POA: Diagnosis not present

## 2015-05-12 DIAGNOSIS — D1801 Hemangioma of skin and subcutaneous tissue: Secondary | ICD-10-CM | POA: Diagnosis not present

## 2015-05-12 DIAGNOSIS — D485 Neoplasm of uncertain behavior of skin: Secondary | ICD-10-CM | POA: Diagnosis not present

## 2015-05-12 DIAGNOSIS — D2262 Melanocytic nevi of left upper limb, including shoulder: Secondary | ICD-10-CM | POA: Diagnosis not present

## 2015-05-12 DIAGNOSIS — L821 Other seborrheic keratosis: Secondary | ICD-10-CM | POA: Diagnosis not present

## 2015-05-22 DIAGNOSIS — M9902 Segmental and somatic dysfunction of thoracic region: Secondary | ICD-10-CM | POA: Diagnosis not present

## 2015-05-22 DIAGNOSIS — M62838 Other muscle spasm: Secondary | ICD-10-CM | POA: Diagnosis not present

## 2015-05-22 DIAGNOSIS — M50321 Other cervical disc degeneration at C4-C5 level: Secondary | ICD-10-CM | POA: Diagnosis not present

## 2015-05-22 DIAGNOSIS — M9901 Segmental and somatic dysfunction of cervical region: Secondary | ICD-10-CM | POA: Diagnosis not present

## 2015-06-09 DIAGNOSIS — M62838 Other muscle spasm: Secondary | ICD-10-CM | POA: Diagnosis not present

## 2015-06-09 DIAGNOSIS — M50321 Other cervical disc degeneration at C4-C5 level: Secondary | ICD-10-CM | POA: Diagnosis not present

## 2015-06-09 DIAGNOSIS — M9901 Segmental and somatic dysfunction of cervical region: Secondary | ICD-10-CM | POA: Diagnosis not present

## 2015-06-09 DIAGNOSIS — M9902 Segmental and somatic dysfunction of thoracic region: Secondary | ICD-10-CM | POA: Diagnosis not present

## 2015-07-13 DIAGNOSIS — M9904 Segmental and somatic dysfunction of sacral region: Secondary | ICD-10-CM | POA: Diagnosis not present

## 2015-07-13 DIAGNOSIS — M9905 Segmental and somatic dysfunction of pelvic region: Secondary | ICD-10-CM | POA: Diagnosis not present

## 2015-07-13 DIAGNOSIS — M5136 Other intervertebral disc degeneration, lumbar region: Secondary | ICD-10-CM | POA: Diagnosis not present

## 2015-07-13 DIAGNOSIS — M9903 Segmental and somatic dysfunction of lumbar region: Secondary | ICD-10-CM | POA: Diagnosis not present

## 2015-07-16 DIAGNOSIS — M9904 Segmental and somatic dysfunction of sacral region: Secondary | ICD-10-CM | POA: Diagnosis not present

## 2015-07-16 DIAGNOSIS — M5136 Other intervertebral disc degeneration, lumbar region: Secondary | ICD-10-CM | POA: Diagnosis not present

## 2015-07-16 DIAGNOSIS — M9905 Segmental and somatic dysfunction of pelvic region: Secondary | ICD-10-CM | POA: Diagnosis not present

## 2015-07-16 DIAGNOSIS — M9903 Segmental and somatic dysfunction of lumbar region: Secondary | ICD-10-CM | POA: Diagnosis not present

## 2015-08-11 ENCOUNTER — Other Ambulatory Visit: Payer: Self-pay | Admitting: Obstetrics & Gynecology

## 2015-08-11 DIAGNOSIS — Z1231 Encounter for screening mammogram for malignant neoplasm of breast: Secondary | ICD-10-CM

## 2015-08-13 DIAGNOSIS — M9903 Segmental and somatic dysfunction of lumbar region: Secondary | ICD-10-CM | POA: Diagnosis not present

## 2015-08-13 DIAGNOSIS — M9905 Segmental and somatic dysfunction of pelvic region: Secondary | ICD-10-CM | POA: Diagnosis not present

## 2015-08-13 DIAGNOSIS — M9904 Segmental and somatic dysfunction of sacral region: Secondary | ICD-10-CM | POA: Diagnosis not present

## 2015-08-13 DIAGNOSIS — M5136 Other intervertebral disc degeneration, lumbar region: Secondary | ICD-10-CM | POA: Diagnosis not present

## 2015-08-19 ENCOUNTER — Ambulatory Visit
Admission: RE | Admit: 2015-08-19 | Discharge: 2015-08-19 | Disposition: A | Discharge status: Home / Self Care | Payer: Medicare Other | Source: Ambulatory Visit | Attending: Obstetrics & Gynecology | Admitting: Obstetrics & Gynecology

## 2015-08-19 DIAGNOSIS — Z1231 Encounter for screening mammogram for malignant neoplasm of breast: Secondary | ICD-10-CM | POA: Diagnosis not present

## 2015-08-26 DIAGNOSIS — H6122 Impacted cerumen, left ear: Secondary | ICD-10-CM | POA: Diagnosis not present

## 2015-09-11 DIAGNOSIS — M5136 Other intervertebral disc degeneration, lumbar region: Secondary | ICD-10-CM | POA: Diagnosis not present

## 2015-09-11 DIAGNOSIS — M9904 Segmental and somatic dysfunction of sacral region: Secondary | ICD-10-CM | POA: Diagnosis not present

## 2015-09-11 DIAGNOSIS — M9902 Segmental and somatic dysfunction of thoracic region: Secondary | ICD-10-CM | POA: Diagnosis not present

## 2015-09-11 DIAGNOSIS — M9903 Segmental and somatic dysfunction of lumbar region: Secondary | ICD-10-CM | POA: Diagnosis not present

## 2015-09-24 DIAGNOSIS — M9904 Segmental and somatic dysfunction of sacral region: Secondary | ICD-10-CM | POA: Diagnosis not present

## 2015-09-24 DIAGNOSIS — M9902 Segmental and somatic dysfunction of thoracic region: Secondary | ICD-10-CM | POA: Diagnosis not present

## 2015-09-24 DIAGNOSIS — M9903 Segmental and somatic dysfunction of lumbar region: Secondary | ICD-10-CM | POA: Diagnosis not present

## 2015-09-24 DIAGNOSIS — M5136 Other intervertebral disc degeneration, lumbar region: Secondary | ICD-10-CM | POA: Diagnosis not present

## 2015-09-25 ENCOUNTER — Ambulatory Visit (INDEPENDENT_AMBULATORY_CARE_PROVIDER_SITE_OTHER): Payer: Medicare Other | Admitting: Cardiovascular Disease

## 2015-09-25 ENCOUNTER — Encounter: Payer: Self-pay | Admitting: Cardiovascular Disease

## 2015-09-25 VITALS — BP 152/74 | HR 58 | Ht 61.0 in | Wt 126.0 lb

## 2015-09-25 DIAGNOSIS — R002 Palpitations: Secondary | ICD-10-CM

## 2015-09-25 NOTE — Patient Instructions (Signed)
Medication Instructions:   NO CHANGES.  Follow-Up: Your physician recommends that you schedule a follow-up appointment ON AN AS NEEDED BASIS.   If you need a refill on your cardiac medications before your next appointment, please call your pharmacy.   

## 2015-09-25 NOTE — Assessment & Plan Note (Signed)
Kristen Merritt returns today for her annual follow-up of palpitations. She is on low-dose atenolol. Palpitations have markedly improved. I suspect these are more related to anxiety as well as potential dietary considerations such as caffeine. She has no other symptoms. I will see her back on an as-needed basis.

## 2015-09-25 NOTE — Progress Notes (Signed)
     09/25/2015 Kristen Merritt   08-Dec-1945  [id number]  Primary Physician Lujean Amel, MD Primary Cardiologist: Lorretta Harp MD Renae Gloss  HPI:  Kristen Merritt is a delightful 70 year old thin appearing recently widowed Caucasian female (husband of 42 years died 2 years ago), mother of 16,  grandmother 4 grandchildren who was formerly a patient of Dr. Durwin [facility]. I last saw her in the office 08/22/14. She has a history of palpitations exacerbated by ingestion of caffeine and/or chocolate. It is also exacerbated by stress. With the recent loss of her husband of 46 years she's been under excessive stress and has noticed increased palpitations. She denies chest pain or shortness of breath. She's had acceptable lipid profile in the past which is followed by her primary care physician. She says since I saw her year ago for palpitations have significantly improved. She associates them with times of anxiety.   Current Outpatient Prescriptions  Medication Sig Dispense Refill  . atenolol (TENORMIN) 25 MG tablet Take 25 mg by mouth daily.     No current facility-administered medications for this visit.     Allergies  Allergen Reactions  . Lodine [Etodolac]     rash  . Mavik [Trandolapril]   . Sulfa Antibiotics     Rash     Social History   Social History  . Marital status: Single    Spouse name: N/A  . Number of children: N/A  . Years of education: N/A   Occupational History  . Not on file.   Social History Main Topics  . Smoking status: Never Smoker  . Smokeless tobacco: Never Used  . Alcohol use Not on file  . Drug use: Unknown  . Sexual activity: Not on file   Other Topics Concern  . Not on file   Social History Narrative  . No narrative on file     Review of Systems: General: negative for chills, fever, night sweats or weight changes.  Cardiovascular: negative for chest pain, dyspnea on exertion, edema, orthopnea, palpitations, paroxysmal nocturnal dyspnea  or shortness of breath Dermatological: negative for rash Respiratory: negative for cough or wheezing Urologic: negative for hematuria Abdominal: negative for nausea, vomiting, diarrhea, bright red blood per rectum, melena, or hematemesis Neurologic: negative for visual changes, syncope, or dizziness All other systems reviewed and are otherwise negative except as noted above.    Blood pressure (!) 152/74, pulse (!) 58, height 5\' 1"  (1.549 m), weight 126 lb (57.2 kg).  General appearance: alert and no distress Neck: no adenopathy, no carotid bruit, no JVD, supple, symmetrical, trachea midline and thyroid not enlarged, symmetric, no tenderness/mass/nodules Lungs: clear to auscultation bilaterally Heart: regular rate and rhythm, S1, S2 normal, no murmur, click, rub or gallop Extremities: extremities normal, atraumatic, no cyanosis or edema  EKG sinus bradycardia at 58 without ST or T-wave changes. I personally reviewed this EKG  ASSESSMENT AND PLAN:   Palpitations Kristen Merritt returns today for her annual follow-up of palpitations. She is on low-dose atenolol. Palpitations have markedly improved. I suspect these are more related to anxiety as well as potential dietary considerations such as caffeine. She has no other symptoms. I will see her back on an as-needed basis.      Lorretta Harp MD FACP,FACC,FAHA, [facility] 09/25/2015 4:33 PM

## 2015-10-01 DIAGNOSIS — L821 Other seborrheic keratosis: Secondary | ICD-10-CM | POA: Diagnosis not present

## 2015-10-01 DIAGNOSIS — Z6823 Body mass index (BMI) 23.0-23.9, adult: Secondary | ICD-10-CM | POA: Diagnosis not present

## 2015-10-01 DIAGNOSIS — Z01419 Encounter for gynecological examination (general) (routine) without abnormal findings: Secondary | ICD-10-CM | POA: Diagnosis not present

## 2015-10-29 DIAGNOSIS — Z23 Encounter for immunization: Secondary | ICD-10-CM | POA: Diagnosis not present

## 2015-11-05 DIAGNOSIS — H35372 Puckering of macula, left eye: Secondary | ICD-10-CM | POA: Diagnosis not present

## 2015-11-05 DIAGNOSIS — H2513 Age-related nuclear cataract, bilateral: Secondary | ICD-10-CM | POA: Diagnosis not present

## 2015-11-05 DIAGNOSIS — M9905 Segmental and somatic dysfunction of pelvic region: Secondary | ICD-10-CM | POA: Diagnosis not present

## 2015-11-05 DIAGNOSIS — H35371 Puckering of macula, right eye: Secondary | ICD-10-CM | POA: Diagnosis not present

## 2015-11-05 DIAGNOSIS — M5136 Other intervertebral disc degeneration, lumbar region: Secondary | ICD-10-CM | POA: Diagnosis not present

## 2015-11-05 DIAGNOSIS — M9904 Segmental and somatic dysfunction of sacral region: Secondary | ICD-10-CM | POA: Diagnosis not present

## 2015-11-05 DIAGNOSIS — M9903 Segmental and somatic dysfunction of lumbar region: Secondary | ICD-10-CM | POA: Diagnosis not present

## 2015-11-05 DIAGNOSIS — H524 Presbyopia: Secondary | ICD-10-CM | POA: Diagnosis not present

## 2015-12-04 DIAGNOSIS — M9904 Segmental and somatic dysfunction of sacral region: Secondary | ICD-10-CM | POA: Diagnosis not present

## 2015-12-04 DIAGNOSIS — M9903 Segmental and somatic dysfunction of lumbar region: Secondary | ICD-10-CM | POA: Diagnosis not present

## 2015-12-04 DIAGNOSIS — M5136 Other intervertebral disc degeneration, lumbar region: Secondary | ICD-10-CM | POA: Diagnosis not present

## 2015-12-04 DIAGNOSIS — M9905 Segmental and somatic dysfunction of pelvic region: Secondary | ICD-10-CM | POA: Diagnosis not present

## 2016-01-01 DIAGNOSIS — M5136 Other intervertebral disc degeneration, lumbar region: Secondary | ICD-10-CM | POA: Diagnosis not present

## 2016-01-01 DIAGNOSIS — M9903 Segmental and somatic dysfunction of lumbar region: Secondary | ICD-10-CM | POA: Diagnosis not present

## 2016-01-01 DIAGNOSIS — M5134 Other intervertebral disc degeneration, thoracic region: Secondary | ICD-10-CM | POA: Diagnosis not present

## 2016-01-01 DIAGNOSIS — M9902 Segmental and somatic dysfunction of thoracic region: Secondary | ICD-10-CM | POA: Diagnosis not present

## 2016-01-20 DIAGNOSIS — M5136 Other intervertebral disc degeneration, lumbar region: Secondary | ICD-10-CM | POA: Diagnosis not present

## 2016-01-20 DIAGNOSIS — M9903 Segmental and somatic dysfunction of lumbar region: Secondary | ICD-10-CM | POA: Diagnosis not present

## 2016-01-20 DIAGNOSIS — M9901 Segmental and somatic dysfunction of cervical region: Secondary | ICD-10-CM | POA: Diagnosis not present

## 2016-01-20 DIAGNOSIS — M50322 Other cervical disc degeneration at C5-C6 level: Secondary | ICD-10-CM | POA: Diagnosis not present

## 2016-02-11 DIAGNOSIS — M5134 Other intervertebral disc degeneration, thoracic region: Secondary | ICD-10-CM | POA: Diagnosis not present

## 2016-02-11 DIAGNOSIS — M9902 Segmental and somatic dysfunction of thoracic region: Secondary | ICD-10-CM | POA: Diagnosis not present

## 2016-02-25 DIAGNOSIS — M9902 Segmental and somatic dysfunction of thoracic region: Secondary | ICD-10-CM | POA: Diagnosis not present

## 2016-02-25 DIAGNOSIS — M5134 Other intervertebral disc degeneration, thoracic region: Secondary | ICD-10-CM | POA: Diagnosis not present

## 2016-04-07 DIAGNOSIS — M9903 Segmental and somatic dysfunction of lumbar region: Secondary | ICD-10-CM | POA: Diagnosis not present

## 2016-04-07 DIAGNOSIS — M9904 Segmental and somatic dysfunction of sacral region: Secondary | ICD-10-CM | POA: Diagnosis not present

## 2016-04-07 DIAGNOSIS — M9905 Segmental and somatic dysfunction of pelvic region: Secondary | ICD-10-CM | POA: Diagnosis not present

## 2016-04-07 DIAGNOSIS — M5136 Other intervertebral disc degeneration, lumbar region: Secondary | ICD-10-CM | POA: Diagnosis not present

## 2016-05-12 DIAGNOSIS — M5136 Other intervertebral disc degeneration, lumbar region: Secondary | ICD-10-CM | POA: Diagnosis not present

## 2016-05-12 DIAGNOSIS — D1801 Hemangioma of skin and subcutaneous tissue: Secondary | ICD-10-CM | POA: Diagnosis not present

## 2016-05-12 DIAGNOSIS — L821 Other seborrheic keratosis: Secondary | ICD-10-CM | POA: Diagnosis not present

## 2016-05-12 DIAGNOSIS — D2262 Melanocytic nevi of left upper limb, including shoulder: Secondary | ICD-10-CM | POA: Diagnosis not present

## 2016-05-12 DIAGNOSIS — D2261 Melanocytic nevi of right upper limb, including shoulder: Secondary | ICD-10-CM | POA: Diagnosis not present

## 2016-05-12 DIAGNOSIS — D485 Neoplasm of uncertain behavior of skin: Secondary | ICD-10-CM | POA: Diagnosis not present

## 2016-05-12 DIAGNOSIS — M9903 Segmental and somatic dysfunction of lumbar region: Secondary | ICD-10-CM | POA: Diagnosis not present

## 2016-05-12 DIAGNOSIS — H6122 Impacted cerumen, left ear: Secondary | ICD-10-CM | POA: Diagnosis not present

## 2016-05-12 DIAGNOSIS — M9904 Segmental and somatic dysfunction of sacral region: Secondary | ICD-10-CM | POA: Diagnosis not present

## 2016-05-12 DIAGNOSIS — M9905 Segmental and somatic dysfunction of pelvic region: Secondary | ICD-10-CM | POA: Diagnosis not present

## 2016-05-20 DIAGNOSIS — Z0000 Encounter for general adult medical examination without abnormal findings: Secondary | ICD-10-CM | POA: Diagnosis not present

## 2016-05-20 DIAGNOSIS — E7800 Pure hypercholesterolemia, unspecified: Secondary | ICD-10-CM | POA: Diagnosis not present

## 2016-05-20 DIAGNOSIS — R002 Palpitations: Secondary | ICD-10-CM | POA: Diagnosis not present

## 2016-05-20 DIAGNOSIS — Z79899 Other long term (current) drug therapy: Secondary | ICD-10-CM | POA: Diagnosis not present

## 2016-06-21 DIAGNOSIS — M5134 Other intervertebral disc degeneration, thoracic region: Secondary | ICD-10-CM | POA: Diagnosis not present

## 2016-06-21 DIAGNOSIS — M9903 Segmental and somatic dysfunction of lumbar region: Secondary | ICD-10-CM | POA: Diagnosis not present

## 2016-06-21 DIAGNOSIS — M9902 Segmental and somatic dysfunction of thoracic region: Secondary | ICD-10-CM | POA: Diagnosis not present

## 2016-06-21 DIAGNOSIS — M5136 Other intervertebral disc degeneration, lumbar region: Secondary | ICD-10-CM | POA: Diagnosis not present

## 2016-07-28 DIAGNOSIS — M5134 Other intervertebral disc degeneration, thoracic region: Secondary | ICD-10-CM | POA: Diagnosis not present

## 2016-07-28 DIAGNOSIS — M9903 Segmental and somatic dysfunction of lumbar region: Secondary | ICD-10-CM | POA: Diagnosis not present

## 2016-07-28 DIAGNOSIS — M9902 Segmental and somatic dysfunction of thoracic region: Secondary | ICD-10-CM | POA: Diagnosis not present

## 2016-07-28 DIAGNOSIS — M5136 Other intervertebral disc degeneration, lumbar region: Secondary | ICD-10-CM | POA: Diagnosis not present

## 2016-09-15 DIAGNOSIS — M9902 Segmental and somatic dysfunction of thoracic region: Secondary | ICD-10-CM | POA: Diagnosis not present

## 2016-09-15 DIAGNOSIS — M9903 Segmental and somatic dysfunction of lumbar region: Secondary | ICD-10-CM | POA: Diagnosis not present

## 2016-09-15 DIAGNOSIS — M5134 Other intervertebral disc degeneration, thoracic region: Secondary | ICD-10-CM | POA: Diagnosis not present

## 2016-09-15 DIAGNOSIS — M5136 Other intervertebral disc degeneration, lumbar region: Secondary | ICD-10-CM | POA: Diagnosis not present

## 2016-11-18 DIAGNOSIS — M9902 Segmental and somatic dysfunction of thoracic region: Secondary | ICD-10-CM | POA: Diagnosis not present

## 2016-11-18 DIAGNOSIS — M5134 Other intervertebral disc degeneration, thoracic region: Secondary | ICD-10-CM | POA: Diagnosis not present

## 2016-11-18 DIAGNOSIS — M9903 Segmental and somatic dysfunction of lumbar region: Secondary | ICD-10-CM | POA: Diagnosis not present

## 2016-12-22 DIAGNOSIS — M5134 Other intervertebral disc degeneration, thoracic region: Secondary | ICD-10-CM | POA: Diagnosis not present

## 2016-12-22 DIAGNOSIS — M9903 Segmental and somatic dysfunction of lumbar region: Secondary | ICD-10-CM | POA: Diagnosis not present

## 2016-12-22 DIAGNOSIS — M9902 Segmental and somatic dysfunction of thoracic region: Secondary | ICD-10-CM | POA: Diagnosis not present

## 2017-01-27 DIAGNOSIS — H35373 Puckering of macula, bilateral: Secondary | ICD-10-CM | POA: Diagnosis not present

## 2017-01-27 DIAGNOSIS — H25013 Cortical age-related cataract, bilateral: Secondary | ICD-10-CM | POA: Diagnosis not present

## 2017-01-27 DIAGNOSIS — H5213 Myopia, bilateral: Secondary | ICD-10-CM | POA: Diagnosis not present

## 2017-02-01 DIAGNOSIS — M5134 Other intervertebral disc degeneration, thoracic region: Secondary | ICD-10-CM | POA: Diagnosis not present

## 2017-02-01 DIAGNOSIS — M9903 Segmental and somatic dysfunction of lumbar region: Secondary | ICD-10-CM | POA: Diagnosis not present

## 2017-02-01 DIAGNOSIS — M9902 Segmental and somatic dysfunction of thoracic region: Secondary | ICD-10-CM | POA: Diagnosis not present

## 2017-03-02 DIAGNOSIS — K635 Polyp of colon: Secondary | ICD-10-CM | POA: Diagnosis not present

## 2017-03-02 DIAGNOSIS — Z800 Family history of malignant neoplasm of digestive organs: Secondary | ICD-10-CM | POA: Diagnosis not present

## 2017-03-07 DIAGNOSIS — K635 Polyp of colon: Secondary | ICD-10-CM | POA: Diagnosis not present

## 2017-03-24 DIAGNOSIS — M9901 Segmental and somatic dysfunction of cervical region: Secondary | ICD-10-CM | POA: Diagnosis not present

## 2017-03-24 DIAGNOSIS — M9903 Segmental and somatic dysfunction of lumbar region: Secondary | ICD-10-CM | POA: Diagnosis not present

## 2017-03-24 DIAGNOSIS — M5136 Other intervertebral disc degeneration, lumbar region: Secondary | ICD-10-CM | POA: Diagnosis not present

## 2017-03-24 DIAGNOSIS — M50322 Other cervical disc degeneration at C5-C6 level: Secondary | ICD-10-CM | POA: Diagnosis not present

## 2017-04-18 DIAGNOSIS — M9901 Segmental and somatic dysfunction of cervical region: Secondary | ICD-10-CM | POA: Diagnosis not present

## 2017-04-18 DIAGNOSIS — M9903 Segmental and somatic dysfunction of lumbar region: Secondary | ICD-10-CM | POA: Diagnosis not present

## 2017-04-18 DIAGNOSIS — M5136 Other intervertebral disc degeneration, lumbar region: Secondary | ICD-10-CM | POA: Diagnosis not present

## 2017-04-18 DIAGNOSIS — M50322 Other cervical disc degeneration at C5-C6 level: Secondary | ICD-10-CM | POA: Diagnosis not present

## 2017-05-04 DIAGNOSIS — L821 Other seborrheic keratosis: Secondary | ICD-10-CM | POA: Diagnosis not present

## 2017-05-04 DIAGNOSIS — D225 Melanocytic nevi of trunk: Secondary | ICD-10-CM | POA: Diagnosis not present

## 2017-05-11 DIAGNOSIS — H6121 Impacted cerumen, right ear: Secondary | ICD-10-CM | POA: Diagnosis not present

## 2017-05-25 DIAGNOSIS — I471 Supraventricular tachycardia: Secondary | ICD-10-CM | POA: Diagnosis not present

## 2017-05-25 DIAGNOSIS — Z0000 Encounter for general adult medical examination without abnormal findings: Secondary | ICD-10-CM | POA: Diagnosis not present

## 2017-05-25 DIAGNOSIS — E7800 Pure hypercholesterolemia, unspecified: Secondary | ICD-10-CM | POA: Diagnosis not present

## 2017-05-25 DIAGNOSIS — Z1159 Encounter for screening for other viral diseases: Secondary | ICD-10-CM | POA: Diagnosis not present

## 2017-05-25 DIAGNOSIS — Z79899 Other long term (current) drug therapy: Secondary | ICD-10-CM | POA: Diagnosis not present

## 2017-06-08 DIAGNOSIS — M5136 Other intervertebral disc degeneration, lumbar region: Secondary | ICD-10-CM | POA: Diagnosis not present

## 2017-06-08 DIAGNOSIS — M9901 Segmental and somatic dysfunction of cervical region: Secondary | ICD-10-CM | POA: Diagnosis not present

## 2017-06-08 DIAGNOSIS — M5031 Other cervical disc degeneration,  high cervical region: Secondary | ICD-10-CM | POA: Diagnosis not present

## 2017-06-08 DIAGNOSIS — M9903 Segmental and somatic dysfunction of lumbar region: Secondary | ICD-10-CM | POA: Diagnosis not present

## 2017-07-12 DIAGNOSIS — J301 Allergic rhinitis due to pollen: Secondary | ICD-10-CM | POA: Diagnosis not present

## 2017-07-12 DIAGNOSIS — H6523 Chronic serous otitis media, bilateral: Secondary | ICD-10-CM | POA: Diagnosis not present

## 2017-07-18 ENCOUNTER — Telehealth: Payer: Self-pay | Admitting: Cardiovascular Disease

## 2017-07-18 DIAGNOSIS — H6993 Unspecified Eustachian tube disorder, bilateral: Secondary | ICD-10-CM | POA: Diagnosis not present

## 2017-07-18 NOTE — Telephone Encounter (Signed)
New Message    Patient is calling because she has had vertigo and sinus for about two weeks. She wants to know what is a safe decongest that she can take. Please call to discuss.

## 2017-07-18 NOTE — Telephone Encounter (Signed)
Returned call to patient of Dr. Gwenlyn Found last seen 09/2015 (f/up PRN). She reports she has cold like symptoms and her ENT told her that her ear tubes are clogged. She has tried coricidin with no relief. Advised OK to take OTC meds without -D or -DM. She voiced understanding.

## 2017-07-21 DIAGNOSIS — R42 Dizziness and giddiness: Secondary | ICD-10-CM | POA: Diagnosis not present

## 2017-07-24 DIAGNOSIS — M5031 Other cervical disc degeneration,  high cervical region: Secondary | ICD-10-CM | POA: Diagnosis not present

## 2017-07-24 DIAGNOSIS — M9901 Segmental and somatic dysfunction of cervical region: Secondary | ICD-10-CM | POA: Diagnosis not present

## 2017-07-26 DIAGNOSIS — M5031 Other cervical disc degeneration,  high cervical region: Secondary | ICD-10-CM | POA: Diagnosis not present

## 2017-07-26 DIAGNOSIS — M9901 Segmental and somatic dysfunction of cervical region: Secondary | ICD-10-CM | POA: Diagnosis not present

## 2017-07-27 DIAGNOSIS — M9901 Segmental and somatic dysfunction of cervical region: Secondary | ICD-10-CM | POA: Diagnosis not present

## 2017-07-27 DIAGNOSIS — M5031 Other cervical disc degeneration,  high cervical region: Secondary | ICD-10-CM | POA: Diagnosis not present

## 2017-07-31 DIAGNOSIS — M5031 Other cervical disc degeneration,  high cervical region: Secondary | ICD-10-CM | POA: Diagnosis not present

## 2017-07-31 DIAGNOSIS — M9901 Segmental and somatic dysfunction of cervical region: Secondary | ICD-10-CM | POA: Diagnosis not present

## 2017-08-02 DIAGNOSIS — H8143 Vertigo of central origin, bilateral: Secondary | ICD-10-CM | POA: Diagnosis not present

## 2017-08-02 DIAGNOSIS — H9313 Tinnitus, bilateral: Secondary | ICD-10-CM | POA: Diagnosis not present

## 2017-08-02 DIAGNOSIS — H6521 Chronic serous otitis media, right ear: Secondary | ICD-10-CM | POA: Diagnosis not present

## 2017-08-03 DIAGNOSIS — M9901 Segmental and somatic dysfunction of cervical region: Secondary | ICD-10-CM | POA: Diagnosis not present

## 2017-08-03 DIAGNOSIS — M5031 Other cervical disc degeneration,  high cervical region: Secondary | ICD-10-CM | POA: Diagnosis not present

## 2017-08-07 DIAGNOSIS — M5031 Other cervical disc degeneration,  high cervical region: Secondary | ICD-10-CM | POA: Diagnosis not present

## 2017-08-07 DIAGNOSIS — M9901 Segmental and somatic dysfunction of cervical region: Secondary | ICD-10-CM | POA: Diagnosis not present

## 2017-08-07 DIAGNOSIS — M5136 Other intervertebral disc degeneration, lumbar region: Secondary | ICD-10-CM | POA: Diagnosis not present

## 2017-08-07 DIAGNOSIS — M9903 Segmental and somatic dysfunction of lumbar region: Secondary | ICD-10-CM | POA: Diagnosis not present

## 2017-08-17 DIAGNOSIS — M9901 Segmental and somatic dysfunction of cervical region: Secondary | ICD-10-CM | POA: Diagnosis not present

## 2017-08-17 DIAGNOSIS — M5031 Other cervical disc degeneration,  high cervical region: Secondary | ICD-10-CM | POA: Diagnosis not present

## 2017-09-14 DIAGNOSIS — M5031 Other cervical disc degeneration,  high cervical region: Secondary | ICD-10-CM | POA: Diagnosis not present

## 2017-09-14 DIAGNOSIS — M9901 Segmental and somatic dysfunction of cervical region: Secondary | ICD-10-CM | POA: Diagnosis not present

## 2017-10-12 DIAGNOSIS — M5031 Other cervical disc degeneration,  high cervical region: Secondary | ICD-10-CM | POA: Diagnosis not present

## 2017-10-12 DIAGNOSIS — M9901 Segmental and somatic dysfunction of cervical region: Secondary | ICD-10-CM | POA: Diagnosis not present

## 2017-12-05 DIAGNOSIS — M5136 Other intervertebral disc degeneration, lumbar region: Secondary | ICD-10-CM | POA: Diagnosis not present

## 2017-12-05 DIAGNOSIS — M9902 Segmental and somatic dysfunction of thoracic region: Secondary | ICD-10-CM | POA: Diagnosis not present

## 2017-12-05 DIAGNOSIS — M5134 Other intervertebral disc degeneration, thoracic region: Secondary | ICD-10-CM | POA: Diagnosis not present

## 2017-12-05 DIAGNOSIS — M9903 Segmental and somatic dysfunction of lumbar region: Secondary | ICD-10-CM | POA: Diagnosis not present

## 2018-01-25 DIAGNOSIS — M5134 Other intervertebral disc degeneration, thoracic region: Secondary | ICD-10-CM | POA: Diagnosis not present

## 2018-01-25 DIAGNOSIS — M5136 Other intervertebral disc degeneration, lumbar region: Secondary | ICD-10-CM | POA: Diagnosis not present

## 2018-01-25 DIAGNOSIS — M9902 Segmental and somatic dysfunction of thoracic region: Secondary | ICD-10-CM | POA: Diagnosis not present

## 2018-01-25 DIAGNOSIS — M9903 Segmental and somatic dysfunction of lumbar region: Secondary | ICD-10-CM | POA: Diagnosis not present

## 2018-03-02 DIAGNOSIS — H2513 Age-related nuclear cataract, bilateral: Secondary | ICD-10-CM | POA: Diagnosis not present

## 2018-03-02 DIAGNOSIS — H35373 Puckering of macula, bilateral: Secondary | ICD-10-CM | POA: Diagnosis not present

## 2018-03-02 DIAGNOSIS — H524 Presbyopia: Secondary | ICD-10-CM | POA: Diagnosis not present

## 2018-03-14 DIAGNOSIS — M9902 Segmental and somatic dysfunction of thoracic region: Secondary | ICD-10-CM | POA: Diagnosis not present

## 2018-03-14 DIAGNOSIS — M9903 Segmental and somatic dysfunction of lumbar region: Secondary | ICD-10-CM | POA: Diagnosis not present

## 2018-03-14 DIAGNOSIS — M5136 Other intervertebral disc degeneration, lumbar region: Secondary | ICD-10-CM | POA: Diagnosis not present

## 2018-03-14 DIAGNOSIS — M5134 Other intervertebral disc degeneration, thoracic region: Secondary | ICD-10-CM | POA: Diagnosis not present

## 2018-06-04 DIAGNOSIS — M5136 Other intervertebral disc degeneration, lumbar region: Secondary | ICD-10-CM | POA: Diagnosis not present

## 2018-06-04 DIAGNOSIS — M9905 Segmental and somatic dysfunction of pelvic region: Secondary | ICD-10-CM | POA: Diagnosis not present

## 2018-06-04 DIAGNOSIS — M9903 Segmental and somatic dysfunction of lumbar region: Secondary | ICD-10-CM | POA: Diagnosis not present

## 2018-06-04 DIAGNOSIS — M9904 Segmental and somatic dysfunction of sacral region: Secondary | ICD-10-CM | POA: Diagnosis not present

## 2018-07-26 DIAGNOSIS — M9905 Segmental and somatic dysfunction of pelvic region: Secondary | ICD-10-CM | POA: Diagnosis not present

## 2018-07-26 DIAGNOSIS — M5136 Other intervertebral disc degeneration, lumbar region: Secondary | ICD-10-CM | POA: Diagnosis not present

## 2018-07-26 DIAGNOSIS — M9903 Segmental and somatic dysfunction of lumbar region: Secondary | ICD-10-CM | POA: Diagnosis not present

## 2018-07-26 DIAGNOSIS — M9904 Segmental and somatic dysfunction of sacral region: Secondary | ICD-10-CM | POA: Diagnosis not present

## 2018-08-23 DIAGNOSIS — D2262 Melanocytic nevi of left upper limb, including shoulder: Secondary | ICD-10-CM | POA: Diagnosis not present

## 2018-08-23 DIAGNOSIS — M9905 Segmental and somatic dysfunction of pelvic region: Secondary | ICD-10-CM | POA: Diagnosis not present

## 2018-08-23 DIAGNOSIS — D2261 Melanocytic nevi of right upper limb, including shoulder: Secondary | ICD-10-CM | POA: Diagnosis not present

## 2018-08-23 DIAGNOSIS — M5136 Other intervertebral disc degeneration, lumbar region: Secondary | ICD-10-CM | POA: Diagnosis not present

## 2018-08-23 DIAGNOSIS — M9904 Segmental and somatic dysfunction of sacral region: Secondary | ICD-10-CM | POA: Diagnosis not present

## 2018-08-23 DIAGNOSIS — M9903 Segmental and somatic dysfunction of lumbar region: Secondary | ICD-10-CM | POA: Diagnosis not present

## 2018-08-23 DIAGNOSIS — L821 Other seborrheic keratosis: Secondary | ICD-10-CM | POA: Diagnosis not present

## 2018-08-23 DIAGNOSIS — D1801 Hemangioma of skin and subcutaneous tissue: Secondary | ICD-10-CM | POA: Diagnosis not present

## 2018-08-23 DIAGNOSIS — D225 Melanocytic nevi of trunk: Secondary | ICD-10-CM | POA: Diagnosis not present

## 2018-08-30 DIAGNOSIS — M5136 Other intervertebral disc degeneration, lumbar region: Secondary | ICD-10-CM | POA: Diagnosis not present

## 2018-08-30 DIAGNOSIS — M9905 Segmental and somatic dysfunction of pelvic region: Secondary | ICD-10-CM | POA: Diagnosis not present

## 2018-08-30 DIAGNOSIS — M9903 Segmental and somatic dysfunction of lumbar region: Secondary | ICD-10-CM | POA: Diagnosis not present

## 2018-08-30 DIAGNOSIS — M9904 Segmental and somatic dysfunction of sacral region: Secondary | ICD-10-CM | POA: Diagnosis not present

## 2018-09-06 DIAGNOSIS — H35373 Puckering of macula, bilateral: Secondary | ICD-10-CM | POA: Diagnosis not present

## 2018-09-06 DIAGNOSIS — H2513 Age-related nuclear cataract, bilateral: Secondary | ICD-10-CM | POA: Diagnosis not present

## 2018-09-06 DIAGNOSIS — H35363 Drusen (degenerative) of macula, bilateral: Secondary | ICD-10-CM | POA: Diagnosis not present

## 2018-09-06 DIAGNOSIS — H43813 Vitreous degeneration, bilateral: Secondary | ICD-10-CM | POA: Diagnosis not present

## 2018-10-04 DIAGNOSIS — M9905 Segmental and somatic dysfunction of pelvic region: Secondary | ICD-10-CM | POA: Diagnosis not present

## 2018-10-04 DIAGNOSIS — M5136 Other intervertebral disc degeneration, lumbar region: Secondary | ICD-10-CM | POA: Diagnosis not present

## 2018-10-04 DIAGNOSIS — M9904 Segmental and somatic dysfunction of sacral region: Secondary | ICD-10-CM | POA: Diagnosis not present

## 2018-10-04 DIAGNOSIS — M9903 Segmental and somatic dysfunction of lumbar region: Secondary | ICD-10-CM | POA: Diagnosis not present

## 2018-10-09 DIAGNOSIS — Z23 Encounter for immunization: Secondary | ICD-10-CM | POA: Diagnosis not present

## 2018-10-11 DIAGNOSIS — M9904 Segmental and somatic dysfunction of sacral region: Secondary | ICD-10-CM | POA: Diagnosis not present

## 2018-10-11 DIAGNOSIS — M5136 Other intervertebral disc degeneration, lumbar region: Secondary | ICD-10-CM | POA: Diagnosis not present

## 2018-10-11 DIAGNOSIS — M9905 Segmental and somatic dysfunction of pelvic region: Secondary | ICD-10-CM | POA: Diagnosis not present

## 2018-10-11 DIAGNOSIS — M9903 Segmental and somatic dysfunction of lumbar region: Secondary | ICD-10-CM | POA: Diagnosis not present

## 2018-11-22 DIAGNOSIS — M9904 Segmental and somatic dysfunction of sacral region: Secondary | ICD-10-CM | POA: Diagnosis not present

## 2018-11-22 DIAGNOSIS — M9903 Segmental and somatic dysfunction of lumbar region: Secondary | ICD-10-CM | POA: Diagnosis not present

## 2018-11-22 DIAGNOSIS — M9905 Segmental and somatic dysfunction of pelvic region: Secondary | ICD-10-CM | POA: Diagnosis not present

## 2018-11-22 DIAGNOSIS — M5136 Other intervertebral disc degeneration, lumbar region: Secondary | ICD-10-CM | POA: Diagnosis not present

## 2018-11-28 DIAGNOSIS — M9904 Segmental and somatic dysfunction of sacral region: Secondary | ICD-10-CM | POA: Diagnosis not present

## 2018-11-28 DIAGNOSIS — M9905 Segmental and somatic dysfunction of pelvic region: Secondary | ICD-10-CM | POA: Diagnosis not present

## 2018-11-28 DIAGNOSIS — M9903 Segmental and somatic dysfunction of lumbar region: Secondary | ICD-10-CM | POA: Diagnosis not present

## 2018-11-28 DIAGNOSIS — M5136 Other intervertebral disc degeneration, lumbar region: Secondary | ICD-10-CM | POA: Diagnosis not present

## 2018-12-06 DIAGNOSIS — M9903 Segmental and somatic dysfunction of lumbar region: Secondary | ICD-10-CM | POA: Diagnosis not present

## 2018-12-06 DIAGNOSIS — M5136 Other intervertebral disc degeneration, lumbar region: Secondary | ICD-10-CM | POA: Diagnosis not present

## 2018-12-06 DIAGNOSIS — M9905 Segmental and somatic dysfunction of pelvic region: Secondary | ICD-10-CM | POA: Diagnosis not present

## 2018-12-06 DIAGNOSIS — M9904 Segmental and somatic dysfunction of sacral region: Secondary | ICD-10-CM | POA: Diagnosis not present

## 2018-12-10 DIAGNOSIS — M5136 Other intervertebral disc degeneration, lumbar region: Secondary | ICD-10-CM | POA: Diagnosis not present

## 2018-12-10 DIAGNOSIS — M9904 Segmental and somatic dysfunction of sacral region: Secondary | ICD-10-CM | POA: Diagnosis not present

## 2018-12-10 DIAGNOSIS — M9905 Segmental and somatic dysfunction of pelvic region: Secondary | ICD-10-CM | POA: Diagnosis not present

## 2018-12-10 DIAGNOSIS — M9903 Segmental and somatic dysfunction of lumbar region: Secondary | ICD-10-CM | POA: Diagnosis not present

## 2019-01-24 ENCOUNTER — Ambulatory Visit (INDEPENDENT_AMBULATORY_CARE_PROVIDER_SITE_OTHER): Payer: PPO | Admitting: Otolaryngology

## 2019-01-24 ENCOUNTER — Other Ambulatory Visit: Payer: Self-pay

## 2019-01-24 VITALS — Temp 97.3°F

## 2019-01-24 DIAGNOSIS — H6123 Impacted cerumen, bilateral: Secondary | ICD-10-CM

## 2019-01-24 DIAGNOSIS — M9903 Segmental and somatic dysfunction of lumbar region: Secondary | ICD-10-CM | POA: Diagnosis not present

## 2019-01-24 DIAGNOSIS — M5136 Other intervertebral disc degeneration, lumbar region: Secondary | ICD-10-CM | POA: Diagnosis not present

## 2019-01-24 DIAGNOSIS — M5031 Other cervical disc degeneration,  high cervical region: Secondary | ICD-10-CM | POA: Diagnosis not present

## 2019-01-24 DIAGNOSIS — M9901 Segmental and somatic dysfunction of cervical region: Secondary | ICD-10-CM | POA: Diagnosis not present

## 2019-01-24 NOTE — Progress Notes (Signed)
HPI: Kristen Merritt is a 74 y.o. female who presents for evaluation of mild itching in her ears.  No hearing problems.  No drainage from her ears.  But she wanted her ears checked as she tends to build up a lot of wax.  Past Medical History:  Diagnosis Date  . Palpitations    No past surgical history on file. Social History   Socioeconomic History  . Marital status: Single    Spouse name: Not on file  . Number of children: Not on file  . Years of education: Not on file  . Highest education level: Not on file  Occupational History  . Not on file  Tobacco Use  . Smoking status: Never Smoker  . Smokeless tobacco: Never Used  Substance and Sexual Activity  . Alcohol use: Not on file  . Drug use: Not on file  . Sexual activity: Not on file  Other Topics Concern  . Not on file  Social History Narrative  . Not on file   Social Determinants of Health   Financial Resource Strain:   . Difficulty of Paying Living Expenses: Not on file  Food Insecurity:   . Worried About Charity fundraiser in the Last Year: Not on file  . Ran Out of Food in the Last Year: Not on file  Transportation Needs:   . Lack of Transportation (Medical): Not on file  . Lack of Transportation (Non-Medical): Not on file  Physical Activity:   . Days of Exercise per Week: Not on file  . Minutes of Exercise per Session: Not on file  Stress:   . Feeling of Stress : Not on file  Social Connections:   . Frequency of Communication with Friends and Family: Not on file  . Frequency of Social Gatherings with Friends and Family: Not on file  . Attends Religious Services: Not on file  . Active Member of Clubs or Organizations: Not on file  . Attends Archivist Meetings: Not on file  . Marital Status: Not on file   Family History  Problem Relation Age of Onset  . Hypertension Mother   . Bladder Cancer Maternal Grandmother   . Diabetes Maternal Grandfather   . Hyperlipidemia Brother   . Hyperlipidemia  Sister   . Hyperlipidemia Child    Allergies  Allergen Reactions  . Lodine [Etodolac]     rash  . Mavik [Trandolapril]   . Sulfa Antibiotics     Rash    Prior to Admission medications   Medication Sig Start Date End Date Taking? Authorizing Provider  atenolol (TENORMIN) 25 MG tablet Take 25 mg by mouth daily.   Yes [provider]     Positive ROS: Otherwise negative  All other systems have been reviewed and were otherwise negative with the exception of those mentioned in the HPI and as above.  Physical Exam: Constitutional: Alert, well-appearing, no acute distress Ears: External ears without lesions or tenderness. Ear canals moderate wax on the right side minimal wax on the left side.  This was cleaned with a curette on both sides.  TMs were clear bilaterally ear canals were clear bilaterally. Nasal: External nose without lesions. Clear nasal passages Oral: Oropharynx clear. Neck: No palpable adenopathy or masses Respiratory: Breathing comfortably  Skin: No facial/neck lesions or rash noted.  Cerumen impaction removal  Date/Time: 01/24/2019 2:09 PM Performed by: Rozetta Nunnery, MD Authorized by: Rozetta Nunnery, MD   Consent:    Consent obtained:  Verbal  Consent given by:  Patient   Risks discussed:  Pain and bleeding Procedure details:    Location:  L ear and R ear   Procedure type: curette   Post-procedure details:    Inspection:  TM intact and canal normal   Hearing quality:  Improved   Patient tolerance of procedure:  Tolerated well, no immediate complications    Assessment: Cerumen buildup  Plan: This was cleaned in the office she will follow-up on a as needed basis.  [name], MD

## 2019-03-28 DIAGNOSIS — M9903 Segmental and somatic dysfunction of lumbar region: Secondary | ICD-10-CM | POA: Diagnosis not present

## 2019-03-28 DIAGNOSIS — M5031 Other cervical disc degeneration,  high cervical region: Secondary | ICD-10-CM | POA: Diagnosis not present

## 2019-03-28 DIAGNOSIS — M9901 Segmental and somatic dysfunction of cervical region: Secondary | ICD-10-CM | POA: Diagnosis not present

## 2019-03-28 DIAGNOSIS — M5136 Other intervertebral disc degeneration, lumbar region: Secondary | ICD-10-CM | POA: Diagnosis not present

## 2019-04-11 DIAGNOSIS — H3581 Retinal edema: Secondary | ICD-10-CM | POA: Diagnosis not present

## 2019-04-11 DIAGNOSIS — H35363 Drusen (degenerative) of macula, bilateral: Secondary | ICD-10-CM | POA: Diagnosis not present

## 2019-04-11 DIAGNOSIS — H43822 Vitreomacular adhesion, left eye: Secondary | ICD-10-CM | POA: Diagnosis not present

## 2019-04-11 DIAGNOSIS — H35373 Puckering of macula, bilateral: Secondary | ICD-10-CM | POA: Diagnosis not present

## 2019-05-02 DIAGNOSIS — M9901 Segmental and somatic dysfunction of cervical region: Secondary | ICD-10-CM | POA: Diagnosis not present

## 2019-05-02 DIAGNOSIS — M5134 Other intervertebral disc degeneration, thoracic region: Secondary | ICD-10-CM | POA: Diagnosis not present

## 2019-05-02 DIAGNOSIS — M50322 Other cervical disc degeneration at C5-C6 level: Secondary | ICD-10-CM | POA: Diagnosis not present

## 2019-05-02 DIAGNOSIS — M9902 Segmental and somatic dysfunction of thoracic region: Secondary | ICD-10-CM | POA: Diagnosis not present

## 2019-05-14 DIAGNOSIS — H2513 Age-related nuclear cataract, bilateral: Secondary | ICD-10-CM | POA: Diagnosis not present

## 2019-05-14 DIAGNOSIS — Z79899 Other long term (current) drug therapy: Secondary | ICD-10-CM | POA: Diagnosis not present

## 2019-05-14 DIAGNOSIS — Z0000 Encounter for general adult medical examination without abnormal findings: Secondary | ICD-10-CM | POA: Diagnosis not present

## 2019-05-14 DIAGNOSIS — E7800 Pure hypercholesterolemia, unspecified: Secondary | ICD-10-CM | POA: Diagnosis not present

## 2019-05-14 DIAGNOSIS — I471 Supraventricular tachycardia: Secondary | ICD-10-CM | POA: Diagnosis not present

## 2019-05-14 DIAGNOSIS — H524 Presbyopia: Secondary | ICD-10-CM | POA: Diagnosis not present

## 2019-06-20 DIAGNOSIS — M9901 Segmental and somatic dysfunction of cervical region: Secondary | ICD-10-CM | POA: Diagnosis not present

## 2019-06-20 DIAGNOSIS — M50322 Other cervical disc degeneration at C5-C6 level: Secondary | ICD-10-CM | POA: Diagnosis not present

## 2019-06-20 DIAGNOSIS — M5134 Other intervertebral disc degeneration, thoracic region: Secondary | ICD-10-CM | POA: Diagnosis not present

## 2019-06-20 DIAGNOSIS — M9902 Segmental and somatic dysfunction of thoracic region: Secondary | ICD-10-CM | POA: Diagnosis not present

## 2019-07-18 DIAGNOSIS — M5134 Other intervertebral disc degeneration, thoracic region: Secondary | ICD-10-CM | POA: Diagnosis not present

## 2019-07-18 DIAGNOSIS — M9901 Segmental and somatic dysfunction of cervical region: Secondary | ICD-10-CM | POA: Diagnosis not present

## 2019-07-18 DIAGNOSIS — M9902 Segmental and somatic dysfunction of thoracic region: Secondary | ICD-10-CM | POA: Diagnosis not present

## 2019-07-18 DIAGNOSIS — M50322 Other cervical disc degeneration at C5-C6 level: Secondary | ICD-10-CM | POA: Diagnosis not present

## 2019-08-29 DIAGNOSIS — L821 Other seborrheic keratosis: Secondary | ICD-10-CM | POA: Diagnosis not present

## 2019-08-29 DIAGNOSIS — L720 Epidermal cyst: Secondary | ICD-10-CM | POA: Diagnosis not present

## 2019-08-29 DIAGNOSIS — D225 Melanocytic nevi of trunk: Secondary | ICD-10-CM | POA: Diagnosis not present

## 2019-08-29 DIAGNOSIS — D1801 Hemangioma of skin and subcutaneous tissue: Secondary | ICD-10-CM | POA: Diagnosis not present

## 2019-08-29 DIAGNOSIS — D2262 Melanocytic nevi of left upper limb, including shoulder: Secondary | ICD-10-CM | POA: Diagnosis not present

## 2019-09-05 DIAGNOSIS — M5134 Other intervertebral disc degeneration, thoracic region: Secondary | ICD-10-CM | POA: Diagnosis not present

## 2019-09-05 DIAGNOSIS — M9902 Segmental and somatic dysfunction of thoracic region: Secondary | ICD-10-CM | POA: Diagnosis not present

## 2019-09-05 DIAGNOSIS — M9901 Segmental and somatic dysfunction of cervical region: Secondary | ICD-10-CM | POA: Diagnosis not present

## 2019-09-05 DIAGNOSIS — M50322 Other cervical disc degeneration at C5-C6 level: Secondary | ICD-10-CM | POA: Diagnosis not present

## 2019-09-19 DIAGNOSIS — M9903 Segmental and somatic dysfunction of lumbar region: Secondary | ICD-10-CM | POA: Diagnosis not present

## 2019-09-19 DIAGNOSIS — M5136 Other intervertebral disc degeneration, lumbar region: Secondary | ICD-10-CM | POA: Diagnosis not present

## 2019-09-19 DIAGNOSIS — M9904 Segmental and somatic dysfunction of sacral region: Secondary | ICD-10-CM | POA: Diagnosis not present

## 2019-09-19 DIAGNOSIS — M9905 Segmental and somatic dysfunction of pelvic region: Secondary | ICD-10-CM | POA: Diagnosis not present

## 2019-10-17 DIAGNOSIS — M5136 Other intervertebral disc degeneration, lumbar region: Secondary | ICD-10-CM | POA: Diagnosis not present

## 2019-10-17 DIAGNOSIS — M9905 Segmental and somatic dysfunction of pelvic region: Secondary | ICD-10-CM | POA: Diagnosis not present

## 2019-10-17 DIAGNOSIS — M9903 Segmental and somatic dysfunction of lumbar region: Secondary | ICD-10-CM | POA: Diagnosis not present

## 2019-10-17 DIAGNOSIS — M9904 Segmental and somatic dysfunction of sacral region: Secondary | ICD-10-CM | POA: Diagnosis not present

## 2019-10-22 DIAGNOSIS — M9903 Segmental and somatic dysfunction of lumbar region: Secondary | ICD-10-CM | POA: Diagnosis not present

## 2019-10-22 DIAGNOSIS — M5136 Other intervertebral disc degeneration, lumbar region: Secondary | ICD-10-CM | POA: Diagnosis not present

## 2019-10-22 DIAGNOSIS — M9905 Segmental and somatic dysfunction of pelvic region: Secondary | ICD-10-CM | POA: Diagnosis not present

## 2019-10-22 DIAGNOSIS — M9904 Segmental and somatic dysfunction of sacral region: Secondary | ICD-10-CM | POA: Diagnosis not present

## 2019-11-14 DIAGNOSIS — H35373 Puckering of macula, bilateral: Secondary | ICD-10-CM | POA: Diagnosis not present

## 2019-11-14 DIAGNOSIS — H524 Presbyopia: Secondary | ICD-10-CM | POA: Diagnosis not present

## 2019-11-14 DIAGNOSIS — H2513 Age-related nuclear cataract, bilateral: Secondary | ICD-10-CM | POA: Diagnosis not present

## 2020-01-22 DIAGNOSIS — M9904 Segmental and somatic dysfunction of sacral region: Secondary | ICD-10-CM | POA: Diagnosis not present

## 2020-01-22 DIAGNOSIS — M5134 Other intervertebral disc degeneration, thoracic region: Secondary | ICD-10-CM | POA: Diagnosis not present

## 2020-01-22 DIAGNOSIS — M9902 Segmental and somatic dysfunction of thoracic region: Secondary | ICD-10-CM | POA: Diagnosis not present

## 2020-01-22 DIAGNOSIS — M5136 Other intervertebral disc degeneration, lumbar region: Secondary | ICD-10-CM | POA: Diagnosis not present

## 2020-01-27 DIAGNOSIS — M5134 Other intervertebral disc degeneration, thoracic region: Secondary | ICD-10-CM | POA: Diagnosis not present

## 2020-01-27 DIAGNOSIS — M5136 Other intervertebral disc degeneration, lumbar region: Secondary | ICD-10-CM | POA: Diagnosis not present

## 2020-01-27 DIAGNOSIS — M9902 Segmental and somatic dysfunction of thoracic region: Secondary | ICD-10-CM | POA: Diagnosis not present

## 2020-01-27 DIAGNOSIS — M9904 Segmental and somatic dysfunction of sacral region: Secondary | ICD-10-CM | POA: Diagnosis not present

## 2020-01-28 DIAGNOSIS — M25562 Pain in left knee: Secondary | ICD-10-CM | POA: Diagnosis not present

## 2020-01-31 DIAGNOSIS — M25562 Pain in left knee: Secondary | ICD-10-CM | POA: Diagnosis not present

## 2020-02-03 DIAGNOSIS — M25562 Pain in left knee: Secondary | ICD-10-CM | POA: Diagnosis not present

## 2020-02-05 DIAGNOSIS — S83232D Complex tear of medial meniscus, current injury, left knee, subsequent encounter: Secondary | ICD-10-CM | POA: Diagnosis not present

## 2020-02-05 DIAGNOSIS — M1712 Unilateral primary osteoarthritis, left knee: Secondary | ICD-10-CM | POA: Diagnosis not present

## 2020-02-05 DIAGNOSIS — M25562 Pain in left knee: Secondary | ICD-10-CM | POA: Diagnosis not present

## 2020-02-06 DIAGNOSIS — M9904 Segmental and somatic dysfunction of sacral region: Secondary | ICD-10-CM | POA: Diagnosis not present

## 2020-02-06 DIAGNOSIS — M9902 Segmental and somatic dysfunction of thoracic region: Secondary | ICD-10-CM | POA: Diagnosis not present

## 2020-02-06 DIAGNOSIS — M5134 Other intervertebral disc degeneration, thoracic region: Secondary | ICD-10-CM | POA: Diagnosis not present

## 2020-02-06 DIAGNOSIS — M5136 Other intervertebral disc degeneration, lumbar region: Secondary | ICD-10-CM | POA: Diagnosis not present

## 2020-02-10 DIAGNOSIS — M5136 Other intervertebral disc degeneration, lumbar region: Secondary | ICD-10-CM | POA: Diagnosis not present

## 2020-02-10 DIAGNOSIS — M9904 Segmental and somatic dysfunction of sacral region: Secondary | ICD-10-CM | POA: Diagnosis not present

## 2020-02-10 DIAGNOSIS — M9902 Segmental and somatic dysfunction of thoracic region: Secondary | ICD-10-CM | POA: Diagnosis not present

## 2020-02-10 DIAGNOSIS — M5134 Other intervertebral disc degeneration, thoracic region: Secondary | ICD-10-CM | POA: Diagnosis not present

## 2020-02-11 DIAGNOSIS — M25562 Pain in left knee: Secondary | ICD-10-CM | POA: Diagnosis not present

## 2020-02-11 DIAGNOSIS — M1712 Unilateral primary osteoarthritis, left knee: Secondary | ICD-10-CM | POA: Diagnosis not present

## 2020-02-11 DIAGNOSIS — S83232D Complex tear of medial meniscus, current injury, left knee, subsequent encounter: Secondary | ICD-10-CM | POA: Diagnosis not present

## 2020-02-13 DIAGNOSIS — M25562 Pain in left knee: Secondary | ICD-10-CM | POA: Diagnosis not present

## 2020-02-13 DIAGNOSIS — M1712 Unilateral primary osteoarthritis, left knee: Secondary | ICD-10-CM | POA: Diagnosis not present

## 2020-02-13 DIAGNOSIS — S83232D Complex tear of medial meniscus, current injury, left knee, subsequent encounter: Secondary | ICD-10-CM | POA: Diagnosis not present

## 2020-02-17 DIAGNOSIS — M5134 Other intervertebral disc degeneration, thoracic region: Secondary | ICD-10-CM | POA: Diagnosis not present

## 2020-02-17 DIAGNOSIS — M5136 Other intervertebral disc degeneration, lumbar region: Secondary | ICD-10-CM | POA: Diagnosis not present

## 2020-02-17 DIAGNOSIS — M9902 Segmental and somatic dysfunction of thoracic region: Secondary | ICD-10-CM | POA: Diagnosis not present

## 2020-02-17 DIAGNOSIS — M9904 Segmental and somatic dysfunction of sacral region: Secondary | ICD-10-CM | POA: Diagnosis not present

## 2020-02-20 DIAGNOSIS — M9904 Segmental and somatic dysfunction of sacral region: Secondary | ICD-10-CM | POA: Diagnosis not present

## 2020-02-20 DIAGNOSIS — M9902 Segmental and somatic dysfunction of thoracic region: Secondary | ICD-10-CM | POA: Diagnosis not present

## 2020-02-20 DIAGNOSIS — M5136 Other intervertebral disc degeneration, lumbar region: Secondary | ICD-10-CM | POA: Diagnosis not present

## 2020-02-20 DIAGNOSIS — M1712 Unilateral primary osteoarthritis, left knee: Secondary | ICD-10-CM | POA: Diagnosis not present

## 2020-02-20 DIAGNOSIS — M5134 Other intervertebral disc degeneration, thoracic region: Secondary | ICD-10-CM | POA: Diagnosis not present

## 2020-02-21 DIAGNOSIS — M25562 Pain in left knee: Secondary | ICD-10-CM | POA: Diagnosis not present

## 2020-02-21 DIAGNOSIS — M1712 Unilateral primary osteoarthritis, left knee: Secondary | ICD-10-CM | POA: Diagnosis not present

## 2020-02-21 DIAGNOSIS — S83232D Complex tear of medial meniscus, current injury, left knee, subsequent encounter: Secondary | ICD-10-CM | POA: Diagnosis not present

## 2020-02-28 DIAGNOSIS — M1712 Unilateral primary osteoarthritis, left knee: Secondary | ICD-10-CM | POA: Diagnosis not present

## 2020-02-28 DIAGNOSIS — M25562 Pain in left knee: Secondary | ICD-10-CM | POA: Diagnosis not present

## 2020-02-28 DIAGNOSIS — S83232D Complex tear of medial meniscus, current injury, left knee, subsequent encounter: Secondary | ICD-10-CM | POA: Diagnosis not present

## 2020-03-26 DIAGNOSIS — M9902 Segmental and somatic dysfunction of thoracic region: Secondary | ICD-10-CM | POA: Diagnosis not present

## 2020-03-26 DIAGNOSIS — M9904 Segmental and somatic dysfunction of sacral region: Secondary | ICD-10-CM | POA: Diagnosis not present

## 2020-03-26 DIAGNOSIS — M5134 Other intervertebral disc degeneration, thoracic region: Secondary | ICD-10-CM | POA: Diagnosis not present

## 2020-03-26 DIAGNOSIS — M5136 Other intervertebral disc degeneration, lumbar region: Secondary | ICD-10-CM | POA: Diagnosis not present

## 2020-04-13 DIAGNOSIS — M25562 Pain in left knee: Secondary | ICD-10-CM | POA: Diagnosis not present

## 2020-04-29 DIAGNOSIS — M5136 Other intervertebral disc degeneration, lumbar region: Secondary | ICD-10-CM | POA: Diagnosis not present

## 2020-04-29 DIAGNOSIS — M5134 Other intervertebral disc degeneration, thoracic region: Secondary | ICD-10-CM | POA: Diagnosis not present

## 2020-04-29 DIAGNOSIS — M9904 Segmental and somatic dysfunction of sacral region: Secondary | ICD-10-CM | POA: Diagnosis not present

## 2020-04-29 DIAGNOSIS — M9902 Segmental and somatic dysfunction of thoracic region: Secondary | ICD-10-CM | POA: Diagnosis not present

## 2020-05-28 DIAGNOSIS — H43822 Vitreomacular adhesion, left eye: Secondary | ICD-10-CM | POA: Diagnosis not present

## 2020-05-28 DIAGNOSIS — H35363 Drusen (degenerative) of macula, bilateral: Secondary | ICD-10-CM | POA: Diagnosis not present

## 2020-05-28 DIAGNOSIS — H3581 Retinal edema: Secondary | ICD-10-CM | POA: Diagnosis not present

## 2020-05-28 DIAGNOSIS — H35373 Puckering of macula, bilateral: Secondary | ICD-10-CM | POA: Diagnosis not present

## 2020-06-04 DIAGNOSIS — Z79899 Other long term (current) drug therapy: Secondary | ICD-10-CM | POA: Diagnosis not present

## 2020-06-04 DIAGNOSIS — I471 Supraventricular tachycardia: Secondary | ICD-10-CM | POA: Diagnosis not present

## 2020-06-04 DIAGNOSIS — E7800 Pure hypercholesterolemia, unspecified: Secondary | ICD-10-CM | POA: Diagnosis not present

## 2020-06-04 DIAGNOSIS — Z0001 Encounter for general adult medical examination with abnormal findings: Secondary | ICD-10-CM | POA: Diagnosis not present

## 2020-07-23 DIAGNOSIS — M5134 Other intervertebral disc degeneration, thoracic region: Secondary | ICD-10-CM | POA: Diagnosis not present

## 2020-07-23 DIAGNOSIS — M9902 Segmental and somatic dysfunction of thoracic region: Secondary | ICD-10-CM | POA: Diagnosis not present

## 2020-07-23 DIAGNOSIS — M9903 Segmental and somatic dysfunction of lumbar region: Secondary | ICD-10-CM | POA: Diagnosis not present

## 2020-07-23 DIAGNOSIS — M5136 Other intervertebral disc degeneration, lumbar region: Secondary | ICD-10-CM | POA: Diagnosis not present

## 2020-08-05 DIAGNOSIS — M5136 Other intervertebral disc degeneration, lumbar region: Secondary | ICD-10-CM | POA: Diagnosis not present

## 2020-08-05 DIAGNOSIS — M5134 Other intervertebral disc degeneration, thoracic region: Secondary | ICD-10-CM | POA: Diagnosis not present

## 2020-08-05 DIAGNOSIS — M9903 Segmental and somatic dysfunction of lumbar region: Secondary | ICD-10-CM | POA: Diagnosis not present

## 2020-08-05 DIAGNOSIS — M9902 Segmental and somatic dysfunction of thoracic region: Secondary | ICD-10-CM | POA: Diagnosis not present

## 2020-08-17 ENCOUNTER — Ambulatory Visit (INDEPENDENT_AMBULATORY_CARE_PROVIDER_SITE_OTHER): Payer: PPO | Admitting: Otolaryngology

## 2020-08-17 ENCOUNTER — Other Ambulatory Visit: Payer: Self-pay

## 2020-08-17 DIAGNOSIS — H6123 Impacted cerumen, bilateral: Secondary | ICD-10-CM

## 2020-08-17 NOTE — Progress Notes (Signed)
HPI: Kristen Merritt is a 75 y.o. female who returns today for evaluation of her ears and to have her ears cleaned.  She was having some itching in her ears and felt like her ears need to be cleaned.  She is getting ready to go on a flight.  She had problems with 1 flight where she had ear pain and pressure and problems for couple days after that.  Past Medical History:  Diagnosis Date   Palpitations    No past surgical history on file. Social History   Socioeconomic History   Marital status: Single    Spouse name: Not on file   Number of children: Not on file   Years of education: Not on file   Highest education level: Not on file  Occupational History   Not on file  Tobacco Use   Smoking status: Never   Smokeless tobacco: Never  Substance and Sexual Activity   Alcohol use: Not on file   Drug use: Not on file   Sexual activity: Not on file  Other Topics Concern   Not on file  Social History Narrative   Not on file   Social Determinants of Health   Financial Resource Strain: Not on file  Food Insecurity: Not on file  Transportation Needs: Not on file  Physical Activity: Not on file  Stress: Not on file  Social Connections: Not on file   Family History  Problem Relation Age of Onset   Hypertension Mother    Bladder Cancer Maternal Grandmother    Diabetes Maternal Grandfather    Hyperlipidemia Brother    Hyperlipidemia Sister    Hyperlipidemia Child    Allergies  Allergen Reactions   Lodine [Etodolac]     rash   Mavik [Trandolapril]    Sulfa Antibiotics     Rash    Prior to Admission medications   Medication Sig Start Date End Date Taking? Authorizing Provider  atenolol (TENORMIN) 25 MG tablet Take 25 mg by mouth daily.    [provider]     Positive ROS: Otherwise negative  All other systems have been reviewed and were otherwise negative with the exception of those mentioned in the HPI and as above.  Physical Exam: Constitutional: Alert,  well-appearing, no acute distress Ears: External ears without lesions or tenderness.  She had moderate wax buildup worse on the right side that was cleaned with curettes.  Ear canals and TMs are otherwise clear. Nasal: External nose without lesions. Septum with mild deformity and mild rhinitis.  Both middle meatus regions are clear with no signs of infection.. Clear nasal passages Oral: Lips and gums without lesions. Tongue and palate mucosa without lesions. Posterior oropharynx clear. Neck: No palpable adenopathy or masses Respiratory: Breathing comfortably  Skin: No facial/neck lesions or rash noted.  Cerumen impaction removal  Date/Time: 08/17/2020 3:38 PM Performed by: Rozetta Nunnery, MD Authorized by: Rozetta Nunnery, MD   Consent:    Consent obtained:  Verbal   Consent given by:  Patient   Risks discussed:  Pain and bleeding Procedure details:    Location:  L ear and R ear   Procedure type: curette   Post-procedure details:    Inspection:  TM intact and canal normal   Hearing quality:  Improved   Procedure completion:  Tolerated well, no immediate complications Comments:     She had moderate wax buildup that was cleaned with curettes.  TMs were otherwise clear.  Assessment: Moderate wax buildup in  both ear canals.  Right side worse than left.  Plan: Ear canals were cleaned in the office today. Discussed with her concerning use of Flonase 2 sprays each nostril at night for 3 to 4 days prior to going on a flight or use of Afrin an hour before going on the flight.  This should help with eustachian tube function equalizing pressure. She can take decongestant tablets because of heart problems. She will follow-up as needed   [name], MD

## 2020-08-24 DIAGNOSIS — M5134 Other intervertebral disc degeneration, thoracic region: Secondary | ICD-10-CM | POA: Diagnosis not present

## 2020-08-24 DIAGNOSIS — M5136 Other intervertebral disc degeneration, lumbar region: Secondary | ICD-10-CM | POA: Diagnosis not present

## 2020-08-24 DIAGNOSIS — M9903 Segmental and somatic dysfunction of lumbar region: Secondary | ICD-10-CM | POA: Diagnosis not present

## 2020-08-24 DIAGNOSIS — M9902 Segmental and somatic dysfunction of thoracic region: Secondary | ICD-10-CM | POA: Diagnosis not present

## 2020-09-07 DIAGNOSIS — M5134 Other intervertebral disc degeneration, thoracic region: Secondary | ICD-10-CM | POA: Diagnosis not present

## 2020-09-07 DIAGNOSIS — M9902 Segmental and somatic dysfunction of thoracic region: Secondary | ICD-10-CM | POA: Diagnosis not present

## 2020-09-07 DIAGNOSIS — M5136 Other intervertebral disc degeneration, lumbar region: Secondary | ICD-10-CM | POA: Diagnosis not present

## 2020-09-07 DIAGNOSIS — M9903 Segmental and somatic dysfunction of lumbar region: Secondary | ICD-10-CM | POA: Diagnosis not present

## 2020-09-10 DIAGNOSIS — M5134 Other intervertebral disc degeneration, thoracic region: Secondary | ICD-10-CM | POA: Diagnosis not present

## 2020-09-10 DIAGNOSIS — M5136 Other intervertebral disc degeneration, lumbar region: Secondary | ICD-10-CM | POA: Diagnosis not present

## 2020-09-10 DIAGNOSIS — M9902 Segmental and somatic dysfunction of thoracic region: Secondary | ICD-10-CM | POA: Diagnosis not present

## 2020-09-10 DIAGNOSIS — M9903 Segmental and somatic dysfunction of lumbar region: Secondary | ICD-10-CM | POA: Diagnosis not present

## 2020-10-05 DIAGNOSIS — M9902 Segmental and somatic dysfunction of thoracic region: Secondary | ICD-10-CM | POA: Diagnosis not present

## 2020-10-05 DIAGNOSIS — M5136 Other intervertebral disc degeneration, lumbar region: Secondary | ICD-10-CM | POA: Diagnosis not present

## 2020-10-05 DIAGNOSIS — M5134 Other intervertebral disc degeneration, thoracic region: Secondary | ICD-10-CM | POA: Diagnosis not present

## 2020-10-05 DIAGNOSIS — M9903 Segmental and somatic dysfunction of lumbar region: Secondary | ICD-10-CM | POA: Diagnosis not present

## 2020-10-07 DIAGNOSIS — T700XXA Otitic barotrauma, initial encounter: Secondary | ICD-10-CM | POA: Diagnosis not present

## 2020-10-07 DIAGNOSIS — H6691 Otitis media, unspecified, right ear: Secondary | ICD-10-CM | POA: Diagnosis not present

## 2020-10-22 DIAGNOSIS — L723 Sebaceous cyst: Secondary | ICD-10-CM | POA: Diagnosis not present

## 2020-10-22 DIAGNOSIS — L821 Other seborrheic keratosis: Secondary | ICD-10-CM | POA: Diagnosis not present

## 2020-10-22 DIAGNOSIS — D1801 Hemangioma of skin and subcutaneous tissue: Secondary | ICD-10-CM | POA: Diagnosis not present

## 2020-10-22 DIAGNOSIS — B351 Tinea unguium: Secondary | ICD-10-CM | POA: Diagnosis not present

## 2020-10-22 DIAGNOSIS — D225 Melanocytic nevi of trunk: Secondary | ICD-10-CM | POA: Diagnosis not present

## 2020-11-20 DIAGNOSIS — H35373 Puckering of macula, bilateral: Secondary | ICD-10-CM | POA: Diagnosis not present

## 2020-11-20 DIAGNOSIS — H5213 Myopia, bilateral: Secondary | ICD-10-CM | POA: Diagnosis not present

## 2020-11-20 DIAGNOSIS — H524 Presbyopia: Secondary | ICD-10-CM | POA: Diagnosis not present

## 2020-11-20 DIAGNOSIS — H2513 Age-related nuclear cataract, bilateral: Secondary | ICD-10-CM | POA: Diagnosis not present

## 2020-11-26 DIAGNOSIS — H6503 Acute serous otitis media, bilateral: Secondary | ICD-10-CM | POA: Diagnosis not present

## 2020-12-15 DIAGNOSIS — M5136 Other intervertebral disc degeneration, lumbar region: Secondary | ICD-10-CM | POA: Diagnosis not present

## 2020-12-15 DIAGNOSIS — M9903 Segmental and somatic dysfunction of lumbar region: Secondary | ICD-10-CM | POA: Diagnosis not present

## 2020-12-15 DIAGNOSIS — M5134 Other intervertebral disc degeneration, thoracic region: Secondary | ICD-10-CM | POA: Diagnosis not present

## 2020-12-15 DIAGNOSIS — M9902 Segmental and somatic dysfunction of thoracic region: Secondary | ICD-10-CM | POA: Diagnosis not present

## 2020-12-29 DIAGNOSIS — B078 Other viral warts: Secondary | ICD-10-CM | POA: Diagnosis not present

## 2020-12-29 DIAGNOSIS — D485 Neoplasm of uncertain behavior of skin: Secondary | ICD-10-CM | POA: Diagnosis not present

## 2021-01-14 DIAGNOSIS — M9903 Segmental and somatic dysfunction of lumbar region: Secondary | ICD-10-CM | POA: Diagnosis not present

## 2021-01-14 DIAGNOSIS — M9902 Segmental and somatic dysfunction of thoracic region: Secondary | ICD-10-CM | POA: Diagnosis not present

## 2021-01-14 DIAGNOSIS — M5134 Other intervertebral disc degeneration, thoracic region: Secondary | ICD-10-CM | POA: Diagnosis not present

## 2021-01-14 DIAGNOSIS — M5136 Other intervertebral disc degeneration, lumbar region: Secondary | ICD-10-CM | POA: Diagnosis not present

## 2021-03-05 ENCOUNTER — Other Ambulatory Visit: Payer: Self-pay | Admitting: Obstetrics & Gynecology

## 2021-03-05 DIAGNOSIS — Z1231 Encounter for screening mammogram for malignant neoplasm of breast: Secondary | ICD-10-CM

## 2021-03-10 DIAGNOSIS — M9904 Segmental and somatic dysfunction of sacral region: Secondary | ICD-10-CM | POA: Diagnosis not present

## 2021-03-10 DIAGNOSIS — M9903 Segmental and somatic dysfunction of lumbar region: Secondary | ICD-10-CM | POA: Diagnosis not present

## 2021-03-10 DIAGNOSIS — M5136 Other intervertebral disc degeneration, lumbar region: Secondary | ICD-10-CM | POA: Diagnosis not present

## 2021-03-10 DIAGNOSIS — M9905 Segmental and somatic dysfunction of pelvic region: Secondary | ICD-10-CM | POA: Diagnosis not present

## 2021-03-11 DIAGNOSIS — Z124 Encounter for screening for malignant neoplasm of cervix: Secondary | ICD-10-CM | POA: Diagnosis not present

## 2021-03-11 DIAGNOSIS — Z6825 Body mass index (BMI) 25.0-25.9, adult: Secondary | ICD-10-CM | POA: Diagnosis not present

## 2021-03-18 ENCOUNTER — Other Ambulatory Visit: Payer: Self-pay

## 2021-03-18 ENCOUNTER — Ambulatory Visit
Admission: RE | Admit: 2021-03-18 | Discharge: 2021-03-18 | Disposition: A | Discharge status: Home / Self Care | Payer: PPO | Source: Ambulatory Visit | Attending: Obstetrics & Gynecology | Admitting: Obstetrics & Gynecology

## 2021-03-18 DIAGNOSIS — Z1231 Encounter for screening mammogram for malignant neoplasm of breast: Secondary | ICD-10-CM

## 2021-04-28 DIAGNOSIS — M9903 Segmental and somatic dysfunction of lumbar region: Secondary | ICD-10-CM | POA: Diagnosis not present

## 2021-04-28 DIAGNOSIS — M5136 Other intervertebral disc degeneration, lumbar region: Secondary | ICD-10-CM | POA: Diagnosis not present

## 2021-04-28 DIAGNOSIS — M9905 Segmental and somatic dysfunction of pelvic region: Secondary | ICD-10-CM | POA: Diagnosis not present

## 2021-04-28 DIAGNOSIS — M9904 Segmental and somatic dysfunction of sacral region: Secondary | ICD-10-CM | POA: Diagnosis not present

## 2021-06-30 DIAGNOSIS — M9905 Segmental and somatic dysfunction of pelvic region: Secondary | ICD-10-CM | POA: Diagnosis not present

## 2021-06-30 DIAGNOSIS — M9904 Segmental and somatic dysfunction of sacral region: Secondary | ICD-10-CM | POA: Diagnosis not present

## 2021-06-30 DIAGNOSIS — M9903 Segmental and somatic dysfunction of lumbar region: Secondary | ICD-10-CM | POA: Diagnosis not present

## 2021-06-30 DIAGNOSIS — M5136 Other intervertebral disc degeneration, lumbar region: Secondary | ICD-10-CM | POA: Diagnosis not present

## 2021-07-07 DIAGNOSIS — H2513 Age-related nuclear cataract, bilateral: Secondary | ICD-10-CM | POA: Diagnosis not present

## 2021-07-07 DIAGNOSIS — H35373 Puckering of macula, bilateral: Secondary | ICD-10-CM | POA: Diagnosis not present

## 2021-07-07 DIAGNOSIS — H43822 Vitreomacular adhesion, left eye: Secondary | ICD-10-CM | POA: Diagnosis not present

## 2021-07-07 DIAGNOSIS — H3581 Retinal edema: Secondary | ICD-10-CM | POA: Diagnosis not present

## 2021-07-23 DIAGNOSIS — H2513 Age-related nuclear cataract, bilateral: Secondary | ICD-10-CM | POA: Diagnosis not present

## 2021-07-23 DIAGNOSIS — H35373 Puckering of macula, bilateral: Secondary | ICD-10-CM | POA: Diagnosis not present

## 2021-07-27 DIAGNOSIS — M9905 Segmental and somatic dysfunction of pelvic region: Secondary | ICD-10-CM | POA: Diagnosis not present

## 2021-07-27 DIAGNOSIS — M9904 Segmental and somatic dysfunction of sacral region: Secondary | ICD-10-CM | POA: Diagnosis not present

## 2021-07-27 DIAGNOSIS — M9903 Segmental and somatic dysfunction of lumbar region: Secondary | ICD-10-CM | POA: Diagnosis not present

## 2021-07-27 DIAGNOSIS — M5136 Other intervertebral disc degeneration, lumbar region: Secondary | ICD-10-CM | POA: Diagnosis not present

## 2021-08-04 DIAGNOSIS — H2512 Age-related nuclear cataract, left eye: Secondary | ICD-10-CM | POA: Diagnosis not present

## 2021-08-04 DIAGNOSIS — H33332 Multiple defects of retina without detachment, left eye: Secondary | ICD-10-CM | POA: Diagnosis not present

## 2021-08-04 DIAGNOSIS — H35372 Puckering of macula, left eye: Secondary | ICD-10-CM | POA: Diagnosis not present

## 2021-08-12 DIAGNOSIS — H35373 Puckering of macula, bilateral: Secondary | ICD-10-CM | POA: Diagnosis not present

## 2021-08-23 DIAGNOSIS — M9904 Segmental and somatic dysfunction of sacral region: Secondary | ICD-10-CM | POA: Diagnosis not present

## 2021-08-23 DIAGNOSIS — M9905 Segmental and somatic dysfunction of pelvic region: Secondary | ICD-10-CM | POA: Diagnosis not present

## 2021-08-23 DIAGNOSIS — M9903 Segmental and somatic dysfunction of lumbar region: Secondary | ICD-10-CM | POA: Diagnosis not present

## 2021-08-23 DIAGNOSIS — M5136 Other intervertebral disc degeneration, lumbar region: Secondary | ICD-10-CM | POA: Diagnosis not present

## 2021-08-26 DIAGNOSIS — M9905 Segmental and somatic dysfunction of pelvic region: Secondary | ICD-10-CM | POA: Diagnosis not present

## 2021-08-26 DIAGNOSIS — M5136 Other intervertebral disc degeneration, lumbar region: Secondary | ICD-10-CM | POA: Diagnosis not present

## 2021-08-26 DIAGNOSIS — M9903 Segmental and somatic dysfunction of lumbar region: Secondary | ICD-10-CM | POA: Diagnosis not present

## 2021-08-26 DIAGNOSIS — M9904 Segmental and somatic dysfunction of sacral region: Secondary | ICD-10-CM | POA: Diagnosis not present

## 2021-09-01 DIAGNOSIS — M9903 Segmental and somatic dysfunction of lumbar region: Secondary | ICD-10-CM | POA: Diagnosis not present

## 2021-09-01 DIAGNOSIS — M9904 Segmental and somatic dysfunction of sacral region: Secondary | ICD-10-CM | POA: Diagnosis not present

## 2021-09-01 DIAGNOSIS — M9905 Segmental and somatic dysfunction of pelvic region: Secondary | ICD-10-CM | POA: Diagnosis not present

## 2021-09-01 DIAGNOSIS — M5136 Other intervertebral disc degeneration, lumbar region: Secondary | ICD-10-CM | POA: Diagnosis not present

## 2021-09-02 DIAGNOSIS — H35373 Puckering of macula, bilateral: Secondary | ICD-10-CM | POA: Diagnosis not present

## 2021-09-07 DIAGNOSIS — L821 Other seborrheic keratosis: Secondary | ICD-10-CM | POA: Diagnosis not present

## 2021-09-07 DIAGNOSIS — D225 Melanocytic nevi of trunk: Secondary | ICD-10-CM | POA: Diagnosis not present

## 2021-10-12 DIAGNOSIS — H6983 Other specified disorders of Eustachian tube, bilateral: Secondary | ICD-10-CM | POA: Diagnosis not present

## 2021-11-04 DIAGNOSIS — H35373 Puckering of macula, bilateral: Secondary | ICD-10-CM | POA: Diagnosis not present

## 2021-11-22 DIAGNOSIS — H26492 Other secondary cataract, left eye: Secondary | ICD-10-CM | POA: Diagnosis not present

## 2021-11-29 DIAGNOSIS — Z961 Presence of intraocular lens: Secondary | ICD-10-CM | POA: Diagnosis not present

## 2021-12-07 DIAGNOSIS — M9903 Segmental and somatic dysfunction of lumbar region: Secondary | ICD-10-CM | POA: Diagnosis not present

## 2021-12-07 DIAGNOSIS — M9904 Segmental and somatic dysfunction of sacral region: Secondary | ICD-10-CM | POA: Diagnosis not present

## 2021-12-07 DIAGNOSIS — M9905 Segmental and somatic dysfunction of pelvic region: Secondary | ICD-10-CM | POA: Diagnosis not present

## 2021-12-07 DIAGNOSIS — M5136 Other intervertebral disc degeneration, lumbar region: Secondary | ICD-10-CM | POA: Diagnosis not present

## 2021-12-31 DIAGNOSIS — Z0001 Encounter for general adult medical examination with abnormal findings: Secondary | ICD-10-CM | POA: Diagnosis not present

## 2021-12-31 DIAGNOSIS — Z79899 Other long term (current) drug therapy: Secondary | ICD-10-CM | POA: Diagnosis not present

## 2021-12-31 DIAGNOSIS — R002 Palpitations: Secondary | ICD-10-CM | POA: Diagnosis not present

## 2021-12-31 DIAGNOSIS — E7800 Pure hypercholesterolemia, unspecified: Secondary | ICD-10-CM | POA: Diagnosis not present

## 2022-01-20 DIAGNOSIS — M9903 Segmental and somatic dysfunction of lumbar region: Secondary | ICD-10-CM | POA: Diagnosis not present

## 2022-01-20 DIAGNOSIS — M9905 Segmental and somatic dysfunction of pelvic region: Secondary | ICD-10-CM | POA: Diagnosis not present

## 2022-01-20 DIAGNOSIS — M5136 Other intervertebral disc degeneration, lumbar region: Secondary | ICD-10-CM | POA: Diagnosis not present

## 2022-01-20 DIAGNOSIS — M9904 Segmental and somatic dysfunction of sacral region: Secondary | ICD-10-CM | POA: Diagnosis not present

## 2022-03-23 DIAGNOSIS — M9904 Segmental and somatic dysfunction of sacral region: Secondary | ICD-10-CM | POA: Diagnosis not present

## 2022-03-23 DIAGNOSIS — M5136 Other intervertebral disc degeneration, lumbar region: Secondary | ICD-10-CM | POA: Diagnosis not present

## 2022-03-23 DIAGNOSIS — M9905 Segmental and somatic dysfunction of pelvic region: Secondary | ICD-10-CM | POA: Diagnosis not present

## 2022-03-23 DIAGNOSIS — M9903 Segmental and somatic dysfunction of lumbar region: Secondary | ICD-10-CM | POA: Diagnosis not present

## 2022-04-28 DIAGNOSIS — H35373 Puckering of macula, bilateral: Secondary | ICD-10-CM | POA: Diagnosis not present

## 2022-04-28 DIAGNOSIS — H04123 Dry eye syndrome of bilateral lacrimal glands: Secondary | ICD-10-CM | POA: Diagnosis not present

## 2022-04-28 DIAGNOSIS — H2511 Age-related nuclear cataract, right eye: Secondary | ICD-10-CM | POA: Diagnosis not present

## 2022-04-28 DIAGNOSIS — H43811 Vitreous degeneration, right eye: Secondary | ICD-10-CM | POA: Diagnosis not present

## 2022-04-28 DIAGNOSIS — D3132 Benign neoplasm of left choroid: Secondary | ICD-10-CM | POA: Diagnosis not present

## 2022-05-25 DIAGNOSIS — M9905 Segmental and somatic dysfunction of pelvic region: Secondary | ICD-10-CM | POA: Diagnosis not present

## 2022-05-25 DIAGNOSIS — M5136 Other intervertebral disc degeneration, lumbar region: Secondary | ICD-10-CM | POA: Diagnosis not present

## 2022-05-25 DIAGNOSIS — M9903 Segmental and somatic dysfunction of lumbar region: Secondary | ICD-10-CM | POA: Diagnosis not present

## 2022-05-25 DIAGNOSIS — M9904 Segmental and somatic dysfunction of sacral region: Secondary | ICD-10-CM | POA: Diagnosis not present

## 2022-06-28 DIAGNOSIS — M5136 Other intervertebral disc degeneration, lumbar region: Secondary | ICD-10-CM | POA: Diagnosis not present

## 2022-06-28 DIAGNOSIS — M9903 Segmental and somatic dysfunction of lumbar region: Secondary | ICD-10-CM | POA: Diagnosis not present

## 2022-06-28 DIAGNOSIS — M9905 Segmental and somatic dysfunction of pelvic region: Secondary | ICD-10-CM | POA: Diagnosis not present

## 2022-06-28 DIAGNOSIS — M9904 Segmental and somatic dysfunction of sacral region: Secondary | ICD-10-CM | POA: Diagnosis not present

## 2022-07-26 DIAGNOSIS — H938X3 Other specified disorders of ear, bilateral: Secondary | ICD-10-CM | POA: Diagnosis not present

## 2022-08-23 DIAGNOSIS — M5136 Other intervertebral disc degeneration, lumbar region: Secondary | ICD-10-CM | POA: Diagnosis not present

## 2022-08-23 DIAGNOSIS — M9903 Segmental and somatic dysfunction of lumbar region: Secondary | ICD-10-CM | POA: Diagnosis not present

## 2022-08-23 DIAGNOSIS — M9905 Segmental and somatic dysfunction of pelvic region: Secondary | ICD-10-CM | POA: Diagnosis not present

## 2022-08-23 DIAGNOSIS — M9904 Segmental and somatic dysfunction of sacral region: Secondary | ICD-10-CM | POA: Diagnosis not present

## 2022-09-06 DIAGNOSIS — M5136 Other intervertebral disc degeneration, lumbar region: Secondary | ICD-10-CM | POA: Diagnosis not present

## 2022-09-06 DIAGNOSIS — M9905 Segmental and somatic dysfunction of pelvic region: Secondary | ICD-10-CM | POA: Diagnosis not present

## 2022-09-06 DIAGNOSIS — M9903 Segmental and somatic dysfunction of lumbar region: Secondary | ICD-10-CM | POA: Diagnosis not present

## 2022-09-06 DIAGNOSIS — M9904 Segmental and somatic dysfunction of sacral region: Secondary | ICD-10-CM | POA: Diagnosis not present

## 2022-09-12 DIAGNOSIS — L821 Other seborrheic keratosis: Secondary | ICD-10-CM | POA: Diagnosis not present

## 2022-09-12 DIAGNOSIS — L820 Inflamed seborrheic keratosis: Secondary | ICD-10-CM | POA: Diagnosis not present

## 2022-09-12 DIAGNOSIS — D225 Melanocytic nevi of trunk: Secondary | ICD-10-CM | POA: Diagnosis not present

## 2022-09-12 DIAGNOSIS — D485 Neoplasm of uncertain behavior of skin: Secondary | ICD-10-CM | POA: Diagnosis not present

## 2022-09-12 DIAGNOSIS — D2239 Melanocytic nevi of other parts of face: Secondary | ICD-10-CM | POA: Diagnosis not present

## 2022-11-21 DIAGNOSIS — M9904 Segmental and somatic dysfunction of sacral region: Secondary | ICD-10-CM | POA: Diagnosis not present

## 2022-11-21 DIAGNOSIS — M5136 Other intervertebral disc degeneration, lumbar region with discogenic back pain only: Secondary | ICD-10-CM | POA: Diagnosis not present

## 2022-11-21 DIAGNOSIS — M9903 Segmental and somatic dysfunction of lumbar region: Secondary | ICD-10-CM | POA: Diagnosis not present

## 2022-11-21 DIAGNOSIS — M9905 Segmental and somatic dysfunction of pelvic region: Secondary | ICD-10-CM | POA: Diagnosis not present

## 2022-12-08 DIAGNOSIS — H2511 Age-related nuclear cataract, right eye: Secondary | ICD-10-CM | POA: Diagnosis not present

## 2023-01-04 DIAGNOSIS — I4719 Other supraventricular tachycardia: Secondary | ICD-10-CM | POA: Diagnosis not present

## 2023-01-04 DIAGNOSIS — Z79899 Other long term (current) drug therapy: Secondary | ICD-10-CM | POA: Diagnosis not present

## 2023-01-04 DIAGNOSIS — E7800 Pure hypercholesterolemia, unspecified: Secondary | ICD-10-CM | POA: Diagnosis not present

## 2023-01-04 DIAGNOSIS — Z0001 Encounter for general adult medical examination with abnormal findings: Secondary | ICD-10-CM | POA: Diagnosis not present

## 2023-03-28 DIAGNOSIS — R21 Rash and other nonspecific skin eruption: Secondary | ICD-10-CM | POA: Diagnosis not present

## 2023-03-28 DIAGNOSIS — I10 Essential (primary) hypertension: Secondary | ICD-10-CM | POA: Diagnosis not present

## 2023-04-03 ENCOUNTER — Emergency Department (HOSPITAL_BASED_OUTPATIENT_CLINIC_OR_DEPARTMENT_OTHER)
Admission: EM | Admit: 2023-04-03 | Discharge: 2023-04-03 | Disposition: A | Discharge status: Home / Self Care | Attending: Emergency Medicine | Admitting: Emergency Medicine

## 2023-04-03 ENCOUNTER — Encounter (HOSPITAL_BASED_OUTPATIENT_CLINIC_OR_DEPARTMENT_OTHER): Payer: Self-pay | Admitting: Emergency Medicine

## 2023-04-03 ENCOUNTER — Other Ambulatory Visit: Payer: Self-pay

## 2023-04-03 ENCOUNTER — Ambulatory Visit: Payer: Self-pay | Admitting: Family Medicine

## 2023-04-03 ENCOUNTER — Emergency Department (HOSPITAL_BASED_OUTPATIENT_CLINIC_OR_DEPARTMENT_OTHER)

## 2023-04-03 ENCOUNTER — Other Ambulatory Visit (HOSPITAL_BASED_OUTPATIENT_CLINIC_OR_DEPARTMENT_OTHER): Payer: Self-pay

## 2023-04-03 DIAGNOSIS — Z79899 Other long term (current) drug therapy: Secondary | ICD-10-CM | POA: Diagnosis not present

## 2023-04-03 DIAGNOSIS — I159 Secondary hypertension, unspecified: Secondary | ICD-10-CM | POA: Diagnosis not present

## 2023-04-03 DIAGNOSIS — I10 Essential (primary) hypertension: Secondary | ICD-10-CM | POA: Diagnosis not present

## 2023-04-03 DIAGNOSIS — I6782 Cerebral ischemia: Secondary | ICD-10-CM | POA: Diagnosis not present

## 2023-04-03 DIAGNOSIS — R519 Headache, unspecified: Secondary | ICD-10-CM | POA: Diagnosis present

## 2023-04-03 HISTORY — DX: Essential (primary) hypertension: I10

## 2023-04-03 LAB — CBC
HCT: 45.5 % (ref 36.0–46.0)
Hemoglobin: 14.8 g/dL (ref 12.0–15.0)
MCH: 28 pg (ref 26.0–34.0)
MCHC: 32.5 g/dL (ref 30.0–36.0)
MCV: 86.2 fL (ref 80.0–100.0)
Platelets: 267 10*3/uL (ref 150–400)
RBC: 5.28 MIL/uL — ABNORMAL HIGH (ref 3.87–5.11)
RDW: 13.6 % (ref 11.5–15.5)
WBC: 5.7 10*3/uL (ref 4.0–10.5)
nRBC: 0 % (ref 0.0–0.2)

## 2023-04-03 LAB — BASIC METABOLIC PANEL
Anion gap: 8 (ref 5–15)
BUN: 23 mg/dL (ref 8–23)
CO2: 27 mmol/L (ref 22–32)
Calcium: 9.7 mg/dL (ref 8.9–10.3)
Chloride: 104 mmol/L (ref 98–111)
Creatinine, Ser: 0.83 mg/dL (ref 0.44–1.00)
GFR, Estimated: >60 mL/min (ref >60)
Glucose, Bld: 97 mg/dL (ref 70–99)
Potassium: 4.2 mmol/L (ref 3.5–5.1)
Sodium: 139 mmol/L (ref 135–145)

## 2023-04-03 MED ORDER — AMLODIPINE BESYLATE 5 MG PO TABS
5.0000 mg | ORAL_TABLET | Freq: Every day | ORAL | 0 refills | Status: DC
Start: 2023-04-03 — End: 2023-07-18
  Filled 2023-04-03: qty 30, 30d supply, fill #0

## 2023-04-03 MED ORDER — AMLODIPINE BESYLATE 5 MG PO TABS
5.0000 mg | ORAL_TABLET | Freq: Once | ORAL | Status: AC
  Administered 2023-04-03: 5 mg via ORAL
  Filled 2023-04-03: qty 1

## 2023-04-03 NOTE — ED Triage Notes (Signed)
 Pt c/o HA and HTN "for a couple days"

## 2023-04-03 NOTE — Telephone Encounter (Signed)
  Chief Complaint: headache Symptoms: HTN, N/V Frequency: yesterday Pertinent Negatives: Patient denies fever, CP, SOB Disposition: [x] ED /[] Urgent Care (no appt availability in office) / [] Appointment(In office/virtual)/ []  [facility]/ [] Home Care/ [] Refused Recommended Disposition /[] [place] [facility]/ []  Follow-up with PCP Additional Notes: Pt Grand-daughter Caitlyn calling reporting that pt has c/o H/A, N/V, and elevated BP since yesterday. Caitlyn is not currently with pt but calling for advise. Based on sx given, triager recommended ED for further evaluation. Of note, Caitlyn's dad is en route to patient. Patient verbalized understanding and to go to ED for evaluation.   Copied from CRM [id number]. Topic: Clinical - Red Word Triage >> Apr 03, 2023 10:26 AM Macon Large wrote: Red Word that prompted transfer to Nurse Triage: elevated blood pressure with headache and nausea Reason for Disposition  [1] Systolic BP  >= 160 OR Diastolic >= 100 AND [2] cardiac (e.g., breathing difficulty, chest pain) or neurologic symptoms (e.g., new-onset blurred or double vision, unsteady gait)  Answer Assessment - Initial Assessment Questions 1. LOCATION: "Where does it hurt?"      Headache, n/v Reports BP 180's/100's This AM 171/93 Reports dx of shingles on Monday, currently on valcyclovir Reports took BP medication this AM 2. ONSET: "When did the headache start?" (Minutes, hours or days)      yesterday 3. PATTERN: "Does the pain come and go, or has it been constant since it started?"     Sx constant, elevated BP intermittent 4. SEVERITY: "How bad is the pain?" and "What does it keep you from doing?"  (e.g., Scale 1-10; mild, moderate, or severe)   - MILD (1-3): doesn't interfere with normal activities    - MODERATE (4-7): interferes with normal activities or awakens from sleep    - SEVERE (8-10): excruciating pain, unable to do any normal activities        Unsure, but grand daughter  thinks severe 5. RECURRENT SYMPTOM: "Have you ever had headaches before?" If Yes, ask: "When was the last time?" and "What happened that time?"      No 6. CAUSE: "What do you think is causing the headache?"     High BP  8. HEAD INJURY: "Has there been any recent injury to the head?"      no 9. OTHER SYMPTOMS: "Do you have any other symptoms?" (fever, stiff neck, eye pain, sore throat, cold symptoms)     GI sx  Answer Assessment - Initial Assessment Questions 1. BLOOD PRESSURE: "What is the blood pressure?" "Did you take at least two measurements 5 minutes apart?"     See above 2. ONSET: "When did you take your blood pressure?"     yesterday 3. HOW: "How did you take your blood pressure?" (e.g., automatic home BP monitor, visiting nurse)     auto 4. HISTORY: "Do you have a history of high blood pressure?"     No, heart palpitations 5. MEDICINES: "Are you taking any medicines for blood pressure?" "Have you missed any doses recently?"     atenolol 6. OTHER SYMPTOMS: "Do you have any symptoms?" (e.g., blurred vision, chest pain, difficulty breathing, headache, weakness)     Unknown, grand-daughter not with her, calling on her behalf  Protocols used: Headache-A-AH, Blood Pressure - High-A-AH

## 2023-04-03 NOTE — ED Provider Notes (Signed)
 [facility] [place] EMERGENCY DEPARTMENT AT [facility] Provider Note   CSN: [id number] Arrival date & time: 04/03/23  1058     History  Chief Complaint  Patient presents with   Headache    Kristen Merritt is a 78 y.o. female.  Patient here with high blood pressure headache.  She has noticed blood pressures been high now the last week or so since outpatient visit here recently.  She has noticed a mild headache here the last few days.  No chest pain.  No weakness numbness vision loss speech changes.  Denies any stroke symptoms or exertional symptoms.  She takes atenolol for palpitations but does not take any blood pressure medicine otherwise.  She denies any nausea vomiting diarrhea.  The history is provided by the patient.       Home Medications Prior to Admission medications   Medication Sig Start Date End Date Taking? Authorizing Provider  amLODipine (NORVASC) 5 MG tablet Take 1 tablet (5 mg total) by mouth daily. 04/03/23 05/03/23 Yes Kazoua Gossen, DO  atenolol (TENORMIN) 25 MG tablet Take 25 mg by mouth daily.    [provider]      Allergies    Lodine [etodolac], Mavik [trandolapril], and Sulfa antibiotics    Review of Systems   Review of Systems  Physical Exam Updated Vital Signs BP (!) 149/68 (BP Location: Left Arm)   Pulse (!) 56   Temp 97.7 F (36.5 C) (Oral)   Resp 16   Wt 61.2 kg   LMP  (LMP Unknown)   SpO2 99%   BMI 25.51 kg/m  Physical Exam Vitals and nursing note reviewed.  Constitutional:      General: She is not in acute distress.    Appearance: She is well-developed. She is not ill-appearing.  HENT:     Head: Normocephalic and atraumatic.     Mouth/Throat:     Mouth: Mucous membranes are moist.     Pharynx: Oropharynx is clear.  Eyes:     General: No visual field deficit.    Extraocular Movements: Extraocular movements intact.     Conjunctiva/sclera: Conjunctivae normal.     Pupils: Pupils are equal, round, and reactive to light.   Cardiovascular:     Rate and Rhythm: Normal rate and regular rhythm.     Heart sounds: Normal heart sounds. No murmur heard. Pulmonary:     Effort: Pulmonary effort is normal. No respiratory distress.     Breath sounds: Normal breath sounds.  Abdominal:     Palpations: Abdomen is soft.     Tenderness: There is no abdominal tenderness.  Musculoskeletal:        General: No swelling.     Cervical back: Normal range of motion and neck supple.  Skin:    General: Skin is warm and dry.     Capillary Refill: Capillary refill takes less than 2 seconds.  Neurological:     Mental Status: She is alert and oriented to person, place, and time.     Cranial Nerves: No cranial nerve deficit, dysarthria or facial asymmetry.     Sensory: No sensory deficit.     Motor: No weakness.     Coordination: Romberg sign negative. Coordination normal.  Psychiatric:        Mood and Affect: Mood normal.     ED Results / Procedures / Treatments   Labs (all labs ordered are listed, but only abnormal results are displayed) Labs Reviewed  CBC - Abnormal; Notable for the  following components:      Result Value   RBC 5.28 (*)    All other components within normal limits  BASIC METABOLIC PANEL    EKG None  Radiology CT Head Wo Contrast Result Date: 04/03/2023 CLINICAL DATA:  Provided history: Headache, increasing frequency or severity. Additional history provided: Hypertension. EXAM: CT HEAD WITHOUT CONTRAST TECHNIQUE: Contiguous axial images were obtained from the base of the skull through the vertex without intravenous contrast. RADIATION DOSE REDUCTION: This exam was performed according to the departmental dose-optimization program which includes automated exposure control, adjustment of the mA and/or kV according to patient size and/or use of iterative reconstruction technique. COMPARISON:  None. FINDINGS: Brain: Mild generalized cerebral atrophy. Patchy and ill-defined hypoattenuation within the cerebral  white matter, nonspecific but compatible with mild chronic small vessel ischemic disease. There is no acute intracranial hemorrhage. No demarcated cortical infarct. No extra-axial fluid collection. No evidence of an intracranial mass. No midline shift. Vascular: No hyperdense vessel.  And sclerotic calcifications. Skull: No calvarial fracture or aggressive osseous lesion. Sinuses/Orbits: No mass or acute finding within the imaged orbits. Minimal mucosal thickening within the right frontal sinus inferiorly. IMPRESSION: 1.  No evidence of an acute intracranial abnormality. 2. Mild chronic small vessel ischemic changes within the cerebral white matter. 3. Mild generalized cerebral atrophy. 4. Minor right frontal sinus mucosal thickening. Electronically Signed   By: Jackey Loge D.O.   On: 04/03/2023 13:09    Procedures Procedures    Medications Ordered in ED Medications  amLODipine (NORVASC) tablet 5 mg (5 mg Oral Given 04/03/23 1350)    ED Course/ Medical Decision Making/ A&P                                 Medical Decision Making Amount and/or Complexity of Data Reviewed Labs: ordered. Radiology: ordered.  Risk Prescription drug management.   CAYLEY PESTER is here with headache high blood pressure.  Blood pressure 179/88 but otherwise unremarkable vitals.  Neurologically she is intact.  She does not have any stroke symptoms not having any chest pain.  Blood pressures been up now for maybe a week or so maybe longer as she had elevated blood pressure mildly at her primary care doctor visit it over a week ago.  She only takes atenolol for palpitations.  No other medications.  Overall I do suspect she has the need for blood pressure management.  I have no concern for stroke but will get a head CT to rule out head bleed given headaches on the last few days.  Will check basic labs to make sure there is no signs of endorgan damage.  Will get EKG.  No concern for ACS or other acute process  otherwise.  EKG shows sinus rhythm.  No ischemic changes.  Lab work per my review and interpretation shows no significant anemia electrolyte abnormality kidney injury or leukocytosis.  Head CT per radiology report per my interpretation is unremarkable.  I reviewed interpreted EKG images and labs.  Will start her on amlodipine and have her follow-up with her primary care doctor.  Discharged in good condition.  This chart was dictated using voice recognition software.  Despite best efforts to proofread,  errors can occur which can change the documentation meaning.         Final Clinical Impression(s) / ED Diagnoses Final diagnoses:  Nonintractable headache, unspecified chronicity pattern, unspecified headache type  Secondary hypertension  Rx / DC Orders ED Discharge Orders          Ordered    amLODipine (NORVASC) 5 MG tablet  Daily        04/03/23 1322              [place], DO 04/03/23 1357

## 2023-04-03 NOTE — Discharge Instructions (Signed)
 Take your next dose of amlodipine tomorrow and continue this daily.  Continue to monitor your blood pressure and follow-up with her primary care doctor.  Please return if you develop stroke symptoms severe chest pain as we discussed.

## 2023-04-05 DIAGNOSIS — M5136 Other intervertebral disc degeneration, lumbar region with discogenic back pain only: Secondary | ICD-10-CM | POA: Diagnosis not present

## 2023-04-05 DIAGNOSIS — M9904 Segmental and somatic dysfunction of sacral region: Secondary | ICD-10-CM | POA: Diagnosis not present

## 2023-04-05 DIAGNOSIS — M9903 Segmental and somatic dysfunction of lumbar region: Secondary | ICD-10-CM | POA: Diagnosis not present

## 2023-04-05 DIAGNOSIS — M9905 Segmental and somatic dysfunction of pelvic region: Secondary | ICD-10-CM | POA: Diagnosis not present

## 2023-04-10 DIAGNOSIS — I4719 Other supraventricular tachycardia: Secondary | ICD-10-CM | POA: Diagnosis not present

## 2023-04-10 DIAGNOSIS — R002 Palpitations: Secondary | ICD-10-CM | POA: Diagnosis not present

## 2023-04-13 ENCOUNTER — Other Ambulatory Visit (HOSPITAL_COMMUNITY): Payer: Self-pay | Admitting: Family Medicine

## 2023-04-13 DIAGNOSIS — I4719 Other supraventricular tachycardia: Secondary | ICD-10-CM

## 2023-04-24 DIAGNOSIS — R002 Palpitations: Secondary | ICD-10-CM | POA: Diagnosis not present

## 2023-04-24 DIAGNOSIS — I4719 Other supraventricular tachycardia: Secondary | ICD-10-CM | POA: Diagnosis not present

## 2023-04-27 ENCOUNTER — Ambulatory Visit (HOSPITAL_COMMUNITY): Attending: Internal Medicine

## 2023-04-27 DIAGNOSIS — R002 Palpitations: Secondary | ICD-10-CM | POA: Diagnosis present

## 2023-04-27 DIAGNOSIS — I080 Rheumatic disorders of both mitral and aortic valves: Secondary | ICD-10-CM | POA: Diagnosis not present

## 2023-04-27 DIAGNOSIS — I4719 Other supraventricular tachycardia: Secondary | ICD-10-CM | POA: Diagnosis not present

## 2023-04-27 LAB — ECHOCARDIOGRAM COMPLETE
Area-P 1/2: 2.59 cm2
P 1/2 time: 1041 ms
S' Lateral: 3.5 cm

## 2023-05-28 IMAGING — MG MM DIGITAL SCREENING BILAT W/ TOMO AND CAD
6 of 10 series · 6 of 30 positions shown · non-contrast
Comparison: Previous exam(s).

CLINICAL DATA: Screening.

EXAM:
DIGITAL SCREENING BILATERAL MAMMOGRAM WITH TOMOSYNTHESIS AND CAD
TECHNIQUE: Bilateral screening digital craniocaudal and mediolateral oblique
mammograms were obtained. Bilateral screening digital breast
tomosynthesis was performed. The images were evaluated with
computer-aided detection.

[R MLO synth-2D]
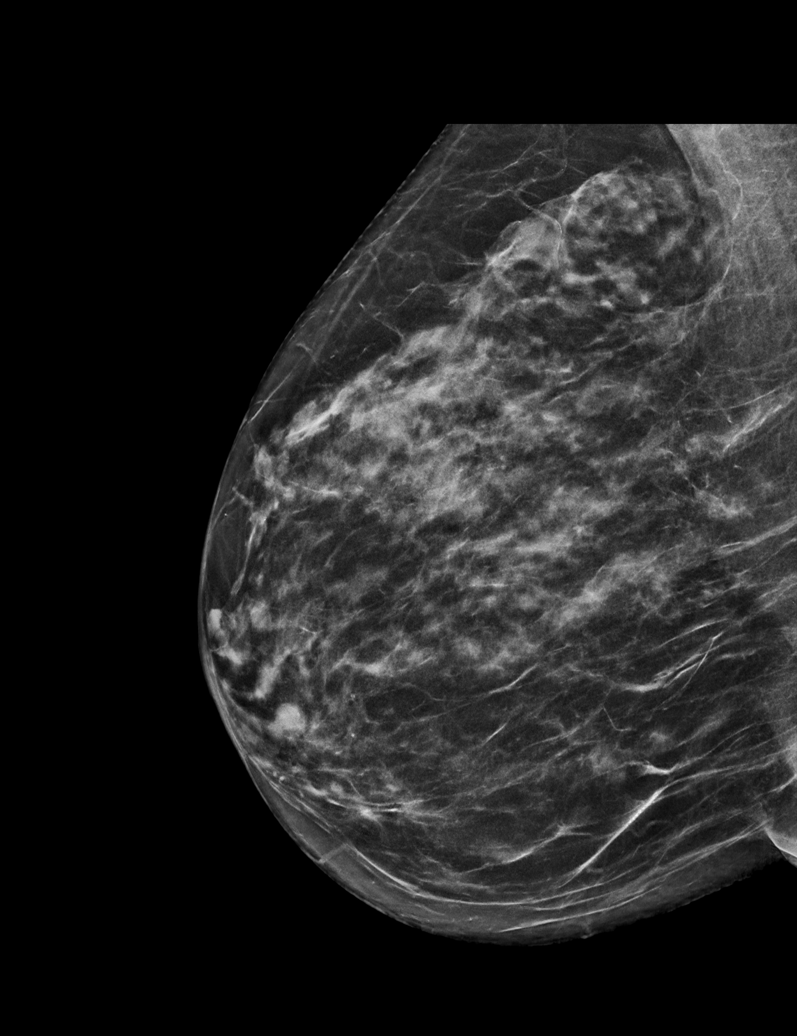

[L MLO synth-2D (1 of 2)]
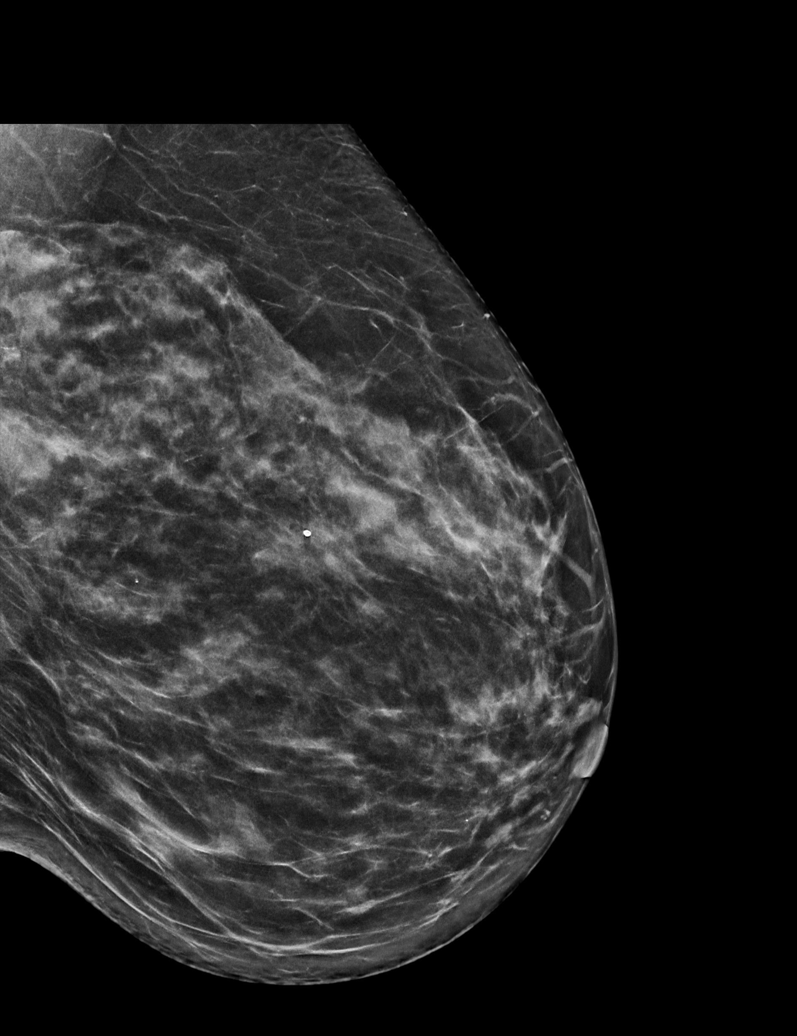

[L MLO synth-2D (2 of 2)]
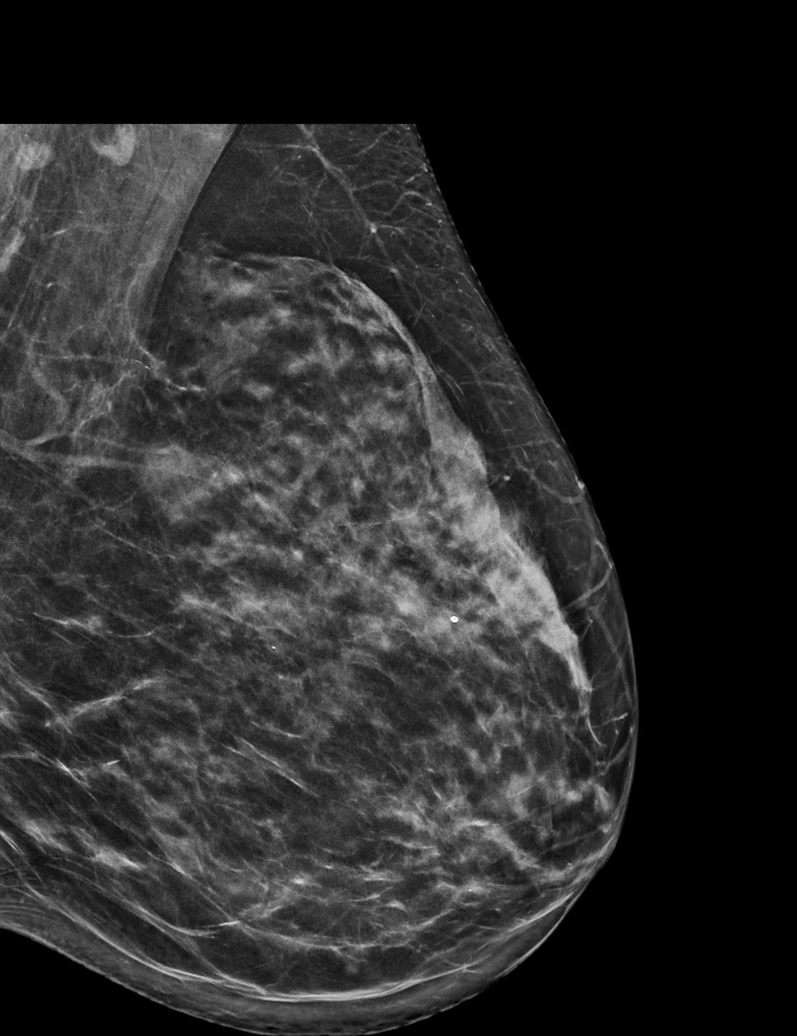

[R CC synth-2D]
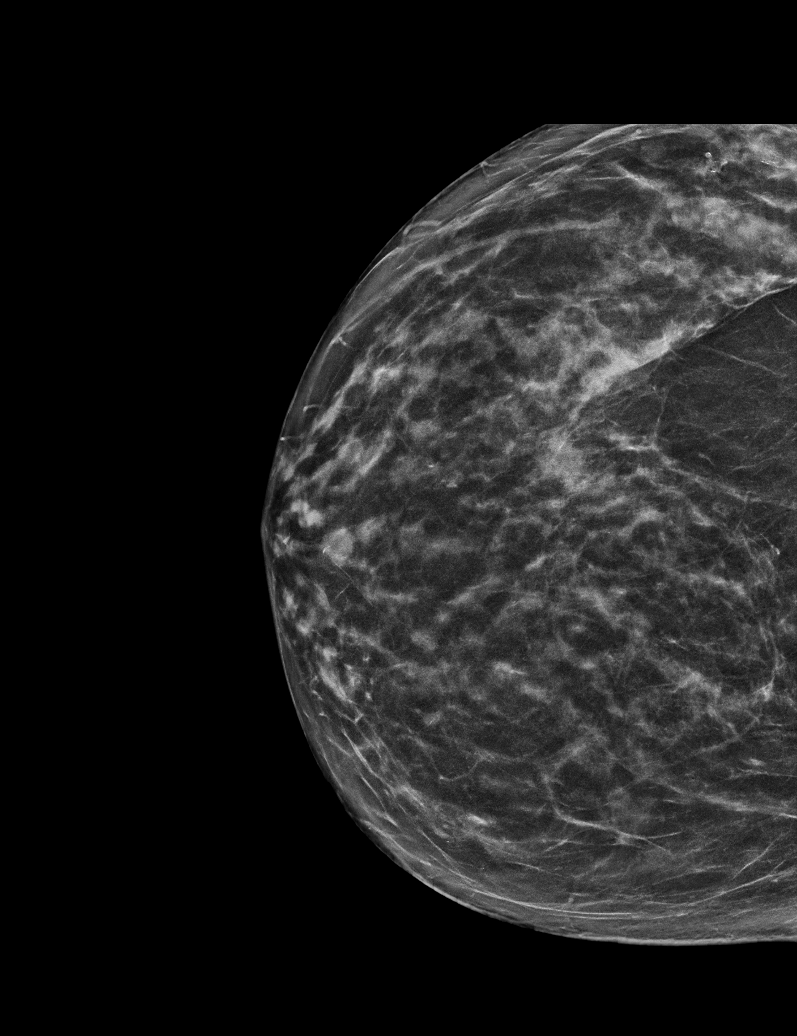

[L CC synth-2D]
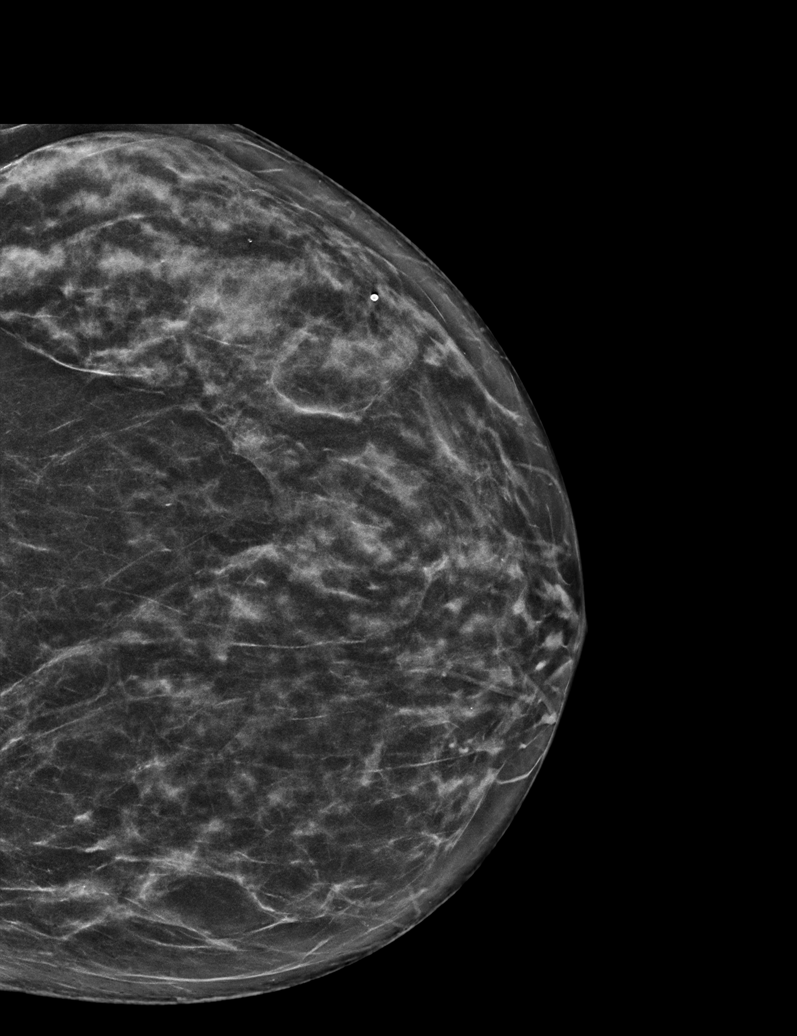

[L CC tomo · tomo slice 31/61.0]
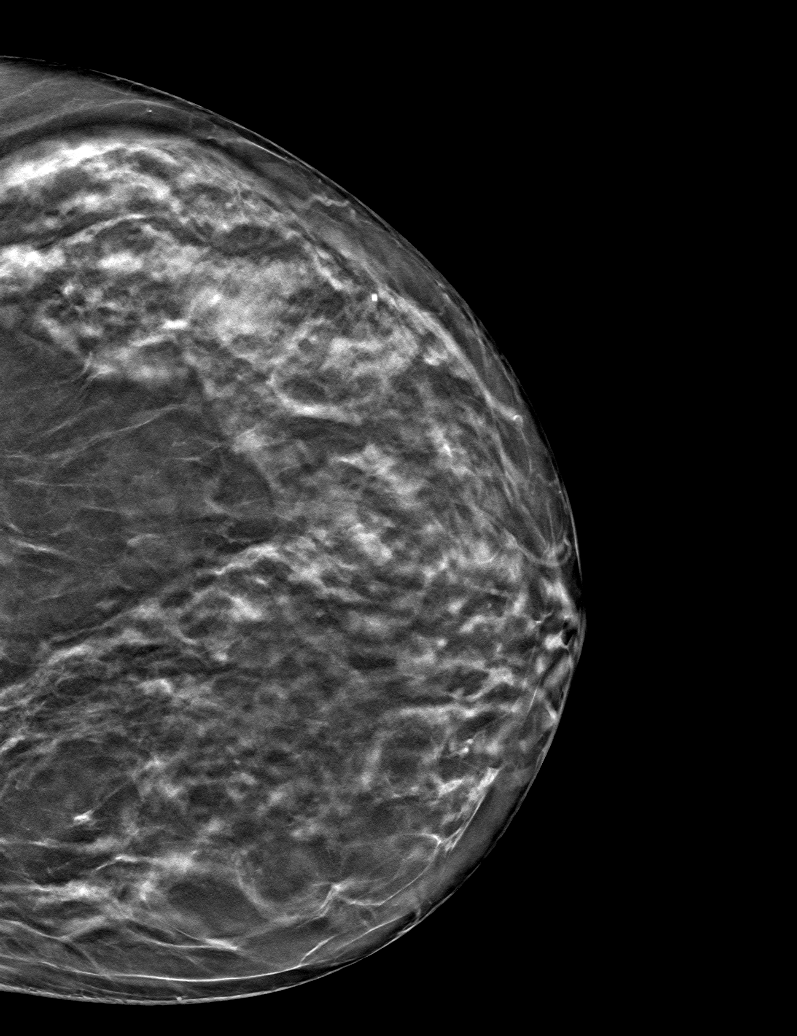

[6 of 30 positions shown; findings below may reference images not displayed]

ACR Breast Density Category c: The breast tissue is heterogeneously
dense, which may obscure small masses.
FINDINGS: There are no findings suspicious for malignancy.
IMPRESSION: No mammographic evidence of malignancy. A result letter of this
screening mammogram will be mailed directly to the patient.

RECOMMENDATION:
Screening mammogram in one year. (Code:Q3-W-BC3)

BI-RADS CATEGORY  1: Negative.

## 2023-05-30 DIAGNOSIS — M9903 Segmental and somatic dysfunction of lumbar region: Secondary | ICD-10-CM | POA: Diagnosis not present

## 2023-05-30 DIAGNOSIS — M5136 Other intervertebral disc degeneration, lumbar region with discogenic back pain only: Secondary | ICD-10-CM | POA: Diagnosis not present

## 2023-05-30 DIAGNOSIS — M9905 Segmental and somatic dysfunction of pelvic region: Secondary | ICD-10-CM | POA: Diagnosis not present

## 2023-05-30 DIAGNOSIS — M9904 Segmental and somatic dysfunction of sacral region: Secondary | ICD-10-CM | POA: Diagnosis not present

## 2023-06-06 DIAGNOSIS — M9903 Segmental and somatic dysfunction of lumbar region: Secondary | ICD-10-CM | POA: Diagnosis not present

## 2023-06-06 DIAGNOSIS — M9904 Segmental and somatic dysfunction of sacral region: Secondary | ICD-10-CM | POA: Diagnosis not present

## 2023-06-06 DIAGNOSIS — M9905 Segmental and somatic dysfunction of pelvic region: Secondary | ICD-10-CM | POA: Diagnosis not present

## 2023-06-06 DIAGNOSIS — M5136 Other intervertebral disc degeneration, lumbar region with discogenic back pain only: Secondary | ICD-10-CM | POA: Diagnosis not present

## 2023-06-30 DIAGNOSIS — Z1331 Encounter for screening for depression: Secondary | ICD-10-CM | POA: Diagnosis not present

## 2023-06-30 DIAGNOSIS — E7800 Pure hypercholesterolemia, unspecified: Secondary | ICD-10-CM | POA: Diagnosis not present

## 2023-06-30 DIAGNOSIS — Z0001 Encounter for general adult medical examination with abnormal findings: Secondary | ICD-10-CM | POA: Diagnosis not present

## 2023-06-30 DIAGNOSIS — I4719 Other supraventricular tachycardia: Secondary | ICD-10-CM | POA: Diagnosis not present

## 2023-06-30 DIAGNOSIS — Z79899 Other long term (current) drug therapy: Secondary | ICD-10-CM | POA: Diagnosis not present

## 2023-07-18 ENCOUNTER — Ambulatory Visit: Attending: Cardiovascular Disease | Admitting: Cardiovascular Disease

## 2023-07-18 ENCOUNTER — Ambulatory Visit: Attending: Cardiovascular Disease

## 2023-07-18 ENCOUNTER — Encounter: Payer: Self-pay | Admitting: Cardiovascular Disease

## 2023-07-18 VITALS — BP 132/80 | HR 66 | Ht 61.0 in | Wt 141.2 lb

## 2023-07-18 DIAGNOSIS — E782 Mixed hyperlipidemia: Secondary | ICD-10-CM | POA: Diagnosis not present

## 2023-07-18 DIAGNOSIS — R002 Palpitations: Secondary | ICD-10-CM

## 2023-07-18 DIAGNOSIS — I351 Nonrheumatic aortic (valve) insufficiency: Secondary | ICD-10-CM | POA: Insufficient documentation

## 2023-07-18 DIAGNOSIS — E785 Hyperlipidemia, unspecified: Secondary | ICD-10-CM | POA: Insufficient documentation

## 2023-07-18 NOTE — Assessment & Plan Note (Signed)
 History of hyperlipidemia not on statin therapy with lipid profile performed 01/04/2023 revealing a total cholesterol 232, LDL of 159 and HDL of 63.  I am going to get a coronary calcium score to her stratify.

## 2023-07-18 NOTE — Patient Instructions (Signed)
 Medication Instructions:  Your physician recommends that you continue on your current medications as directed. Please refer to the Current Medication list given to you today.  *If you need a refill on your cardiac medications before your next appointment, please call your pharmacy*  Lab Work: If you have labs (blood work) drawn today and your tests are completely normal, you will receive your results only by: MyChart Message (if you have MyChart) OR A paper copy in the mail If you have any lab test that is abnormal or we need to change your treatment, we will call you to review the results.  Testing/Procedures: Your physician has recommended that you wear a monitor for 1 week . Monitors are medical devices that record the heart's electrical activity. Doctors most often us  these monitors to diagnose arrhythmias. Arrhythmias are problems with the speed or rhythm of the heartbeat. The monitor is a small, portable device. You can wear one while you do your normal daily activities. This is usually used to diagnose what is causing palpitations/syncope (passing out).  CT scanning for a cardiac calcium score (CAT scanning), is a noninvasive, special x-ray that produces cross-sectional images of the body using x-rays and a computer. CT scans help physicians diagnose and treat medical conditions. For some CT exams, a contrast material is used to enhance visibility in the area of the body being studied. CT scans provide greater clarity and reveal more details than regular x-ray exams.  Your physician has requested that you have an echocardiogram in 1 year. Echocardiography is a painless test that uses sound waves to create images of your heart. It provides your doctor with information about the size and shape of your heart and how well your heart's chambers and valves are working. This procedure takes approximately one hour. There are no restrictions for this procedure. Please do NOT wear cologne, perfume,  aftershave, or lotions (deodorant is allowed). Please arrive 15 minutes prior to your appointment time.  Please note: We ask at that you not bring children with you during ultrasound (echo/ vascular) testing. Due to room size and safety concerns, children are not allowed in the ultrasound rooms during exams. Our front office staff cannot provide observation of children in our lobby area while testing is being conducted. An adult accompanying a patient to their appointment will only be allowed in the ultrasound room at the discretion of the ultrasound technician under special circumstances. We apologize for any inconvenience.   Follow-Up: At [facility], you and your health needs are our priority.  As part of our continuing mission to provide you with exceptional heart care, our providers are all part of one team.  This team includes your primary Cardiologist (physician) and Advanced Practice Providers or APPs (Physician Assistants and Nurse Practitioners) who all work together to provide you with the care you need, when you need it.  Your next appointment:   6 month(s)  Provider:   Damien Braver, NP      Then, Dr. Court will plan to see you again in 1 year(s).    We recommend signing up for the patient portal called MyChart.  Sign up information is provided on this After Visit Summary.  MyChart is used to connect with patients for Virtual Visits (Telemedicine).  Patients are able to view lab/test results, encounter notes, upcoming appointments, etc.  Non-urgent messages can be sent to your provider as well.   To learn more about what you can do with MyChart, go to [email or web address].

## 2023-07-18 NOTE — Assessment & Plan Note (Signed)
 History of palpitations dating back many years.  She was evaluated by Dr. Morgan prior to me seeing her in 2017 and apparently had PSVT.  Her palpitations are exacerbated by caffeine although she still eats chocolate.  She does complain of occasional palp palpitations and has PVCs on her twelve-lead EKG today on low-dose atenolol.  I am going to obtain a 1 week Zio patch to further evaluate.

## 2023-07-18 NOTE — Progress Notes (Signed)
 07/18/2023 Niels LOISE Greet   18-Oct-1945  [id number]  Primary Physician Regino Slater, MD Primary Cardiologist: Dorn JINNY Lesches MD GENI [place], [place], [place]  HPI:  Kristen Merritt is a 78 y.o.   thin appearing recently widowed Caucasian female (husband of 46 years died 9 years ago), mother of 3,  grandmother 4 grandchildren who was formerly a patient of Dr. Daria Little's. I last saw her in the office 09/25/2015.  She is accompanied by her daughters Sonny and Randall today.  She has a history of palpitations exacerbated by ingestion of caffeine and/or chocolate. It is also exacerbated by stress.   Since I saw her 8 years ago she has been complaining of some fatigue.  She continues to have palpitations.  A recent 2D echo ordered by her PCP/10/25 revealed low normal LV function with an EF of 50 to 55% with mild to moderate aortic insufficiency.  She apparently was started on Valtrex for what was thought to be shingles and had a drug reaction with hypertension briefly requiring amlodipine  which has since been discontinued.  She remains on atenolol which she has been on since I saw her 8 years ago for her palpitations.   Current Meds  Medication Sig   atenolol (TENORMIN) 25 MG tablet Take 25 mg by mouth daily.   cholecalciferol (VITAMIN D3) 25 MCG (1000 UNIT) tablet Take 1,000 Units by mouth daily.     Allergies  Allergen Reactions   Lodine [Etodolac]     rash   Mavik [Trandolapril]    Sulfa Antibiotics     Rash    Micardis [Telmisartan] Rash    Social History   Socioeconomic History   Marital status: Single    Spouse name: Not on file   Number of children: Not on file   Years of education: Not on file   Highest education level: Not on file  Occupational History   Not on file  Tobacco Use   Smoking status: Never   Smokeless tobacco: Never  Substance and Sexual Activity   Alcohol use: Never   Drug use: Never   Sexual activity: Not on file  Other Topics Concern   Not on file   Social History Narrative   Not on file   Social Drivers of Health   Financial Resource Strain: Not on file  Food Insecurity: Not on file  Transportation Needs: Not on file  Physical Activity: Not on file  Stress: Not on file  Social Connections: Not on file  Intimate Partner Violence: Not on file     Review of Systems: General: negative for chills, fever, night sweats or weight changes.  Cardiovascular: negative for chest pain, dyspnea on exertion, edema, orthopnea, palpitations, paroxysmal nocturnal dyspnea or shortness of breath Dermatological: negative for rash Respiratory: negative for cough or wheezing Urologic: negative for hematuria Abdominal: negative for nausea, vomiting, diarrhea, bright red blood per rectum, melena, or hematemesis Neurologic: negative for visual changes, syncope, or dizziness All other systems reviewed and are otherwise negative except as noted above.    Blood pressure 132/80, pulse 66, height 5' 1 (1.549 m), weight 141 lb 3.2 oz (64 kg), SpO2 99%.  General appearance: alert and no distress Neck: no adenopathy, no carotid bruit, no JVD, supple, symmetrical, trachea midline, and thyroid  not enlarged, symmetric, no tenderness/mass/nodules Lungs: clear to auscultation bilaterally Heart: regular rate and rhythm, S1, S2 normal, no murmur, click, rub or gallop Extremities: extremities normal, atraumatic, no cyanosis or edema Pulses: 2+ and  symmetric Skin: Skin color, texture, turgor normal. No rashes or lesions Neurologic: Grossly normal  EKG EKG Interpretation Date/Time:  Tuesday July 18 2023 09:33:25 EDT Ventricular Rate:  66 PR Interval:  180 QRS Duration:  82 QT Interval:  396 QTC Calculation: 415 R Axis:   6  Text Interpretation: Sinus rhythm with frequent Premature ventricular complexes When compared with ECG of 03-Apr-2023 13:44, PREVIOUS ECG IS PRESENT Confirmed by Court Carrier [phone]) on 07/18/2023 10:01:52 AM    ASSESSMENT AND PLAN:    Palpitations History of palpitations dating back many years.  She was evaluated by Dr. Morgan prior to me seeing her in 2017 and apparently had PSVT.  Her palpitations are exacerbated by caffeine although she still eats chocolate.  She does complain of occasional palp palpitations and has PVCs on her twelve-lead EKG today on low-dose atenolol.  I am going to obtain a 1 week Zio patch to further evaluate.  Hyperlipidemia History of hyperlipidemia not on statin therapy with lipid profile performed 01/04/2023 revealing a total cholesterol 232, LDL of 159 and HDL of 63.  I am going to get a coronary calcium score to her stratify.  Aortic insufficiency Recent 2D echocardiogram performed/10/25 revealed low normal LV systolic function, grade 1 diastolic dysfunction with mild to moderate aortic insufficiency.  Her LV size is normal.  Will continue to follow this on an annual basis.     Carrier DOROTHA Court MD FACP,FACC,FAHA, [facility] 07/18/2023 10:16 AM

## 2023-07-18 NOTE — Assessment & Plan Note (Signed)
 Recent 2D echocardiogram performed/10/25 revealed low normal LV systolic function, grade 1 diastolic dysfunction with mild to moderate aortic insufficiency.  Her LV size is normal.  Will continue to follow this on an annual basis.

## 2023-07-28 DIAGNOSIS — H6122 Impacted cerumen, left ear: Secondary | ICD-10-CM | POA: Diagnosis not present

## 2023-08-02 ENCOUNTER — Ambulatory Visit (HOSPITAL_BASED_OUTPATIENT_CLINIC_OR_DEPARTMENT_OTHER)
Admission: RE | Admit: 2023-08-02 | Discharge: 2023-08-02 | Disposition: A | Discharge status: Home / Self Care | Payer: Self-pay | Source: Ambulatory Visit | Attending: Cardiovascular Disease | Admitting: Cardiovascular Disease

## 2023-08-02 DIAGNOSIS — I351 Nonrheumatic aortic (valve) insufficiency: Secondary | ICD-10-CM | POA: Insufficient documentation

## 2023-08-02 DIAGNOSIS — E782 Mixed hyperlipidemia: Secondary | ICD-10-CM | POA: Insufficient documentation

## 2023-08-02 DIAGNOSIS — R002 Palpitations: Secondary | ICD-10-CM | POA: Insufficient documentation

## 2023-08-06 ENCOUNTER — Ambulatory Visit: Payer: Self-pay | Admitting: Cardiovascular Disease

## 2023-08-07 DIAGNOSIS — I351 Nonrheumatic aortic (valve) insufficiency: Secondary | ICD-10-CM | POA: Diagnosis not present

## 2023-08-07 DIAGNOSIS — R002 Palpitations: Secondary | ICD-10-CM | POA: Diagnosis not present

## 2023-08-13 DIAGNOSIS — I351 Nonrheumatic aortic (valve) insufficiency: Secondary | ICD-10-CM | POA: Diagnosis not present

## 2023-08-13 DIAGNOSIS — R002 Palpitations: Secondary | ICD-10-CM

## 2023-08-17 DIAGNOSIS — M9903 Segmental and somatic dysfunction of lumbar region: Secondary | ICD-10-CM | POA: Diagnosis not present

## 2023-08-17 DIAGNOSIS — M9905 Segmental and somatic dysfunction of pelvic region: Secondary | ICD-10-CM | POA: Diagnosis not present

## 2023-08-17 DIAGNOSIS — M9904 Segmental and somatic dysfunction of sacral region: Secondary | ICD-10-CM | POA: Diagnosis not present

## 2023-08-17 DIAGNOSIS — M5136 Other intervertebral disc degeneration, lumbar region with discogenic back pain only: Secondary | ICD-10-CM | POA: Diagnosis not present

## 2023-09-14 DIAGNOSIS — L821 Other seborrheic keratosis: Secondary | ICD-10-CM | POA: Diagnosis not present

## 2023-09-14 DIAGNOSIS — D2262 Melanocytic nevi of left upper limb, including shoulder: Secondary | ICD-10-CM | POA: Diagnosis not present

## 2023-09-14 DIAGNOSIS — D225 Melanocytic nevi of trunk: Secondary | ICD-10-CM | POA: Diagnosis not present

## 2023-09-14 DIAGNOSIS — D2261 Melanocytic nevi of right upper limb, including shoulder: Secondary | ICD-10-CM | POA: Diagnosis not present

## 2023-09-27 DIAGNOSIS — M9903 Segmental and somatic dysfunction of lumbar region: Secondary | ICD-10-CM | POA: Diagnosis not present

## 2023-09-27 DIAGNOSIS — M9905 Segmental and somatic dysfunction of pelvic region: Secondary | ICD-10-CM | POA: Diagnosis not present

## 2023-09-27 DIAGNOSIS — M9904 Segmental and somatic dysfunction of sacral region: Secondary | ICD-10-CM | POA: Diagnosis not present

## 2023-09-27 DIAGNOSIS — M5136 Other intervertebral disc degeneration, lumbar region with discogenic back pain only: Secondary | ICD-10-CM | POA: Diagnosis not present

## 2023-10-02 DIAGNOSIS — M9903 Segmental and somatic dysfunction of lumbar region: Secondary | ICD-10-CM | POA: Diagnosis not present

## 2023-10-02 DIAGNOSIS — M9905 Segmental and somatic dysfunction of pelvic region: Secondary | ICD-10-CM | POA: Diagnosis not present

## 2023-10-02 DIAGNOSIS — M5136 Other intervertebral disc degeneration, lumbar region with discogenic back pain only: Secondary | ICD-10-CM | POA: Diagnosis not present

## 2023-10-02 DIAGNOSIS — M9904 Segmental and somatic dysfunction of sacral region: Secondary | ICD-10-CM | POA: Diagnosis not present

## 2023-12-12 DIAGNOSIS — M9903 Segmental and somatic dysfunction of lumbar region: Secondary | ICD-10-CM | POA: Diagnosis not present

## 2023-12-12 DIAGNOSIS — M9905 Segmental and somatic dysfunction of pelvic region: Secondary | ICD-10-CM | POA: Diagnosis not present

## 2023-12-12 DIAGNOSIS — M5136 Other intervertebral disc degeneration, lumbar region with discogenic back pain only: Secondary | ICD-10-CM | POA: Diagnosis not present

## 2023-12-12 DIAGNOSIS — M9904 Segmental and somatic dysfunction of sacral region: Secondary | ICD-10-CM | POA: Diagnosis not present

## 2023-12-21 DIAGNOSIS — H04123 Dry eye syndrome of bilateral lacrimal glands: Secondary | ICD-10-CM | POA: Diagnosis not present

## 2023-12-21 DIAGNOSIS — H2511 Age-related nuclear cataract, right eye: Secondary | ICD-10-CM | POA: Diagnosis not present

## 2024-02-07 ENCOUNTER — Encounter: Payer: Self-pay | Admitting: Nurse Practitioner

## 2024-02-07 ENCOUNTER — Ambulatory Visit: Attending: Nurse Practitioner | Admitting: Nurse Practitioner

## 2024-02-07 VITALS — BP 126/80 | HR 72 | Ht 61.5 in | Wt 141.0 lb

## 2024-02-07 DIAGNOSIS — I493 Ventricular premature depolarization: Secondary | ICD-10-CM

## 2024-02-07 DIAGNOSIS — I340 Nonrheumatic mitral (valve) insufficiency: Secondary | ICD-10-CM | POA: Diagnosis not present

## 2024-02-07 DIAGNOSIS — R002 Palpitations: Secondary | ICD-10-CM

## 2024-02-07 DIAGNOSIS — I471 Supraventricular tachycardia, unspecified: Secondary | ICD-10-CM

## 2024-02-07 DIAGNOSIS — I351 Nonrheumatic aortic (valve) insufficiency: Secondary | ICD-10-CM | POA: Diagnosis not present

## 2024-02-07 DIAGNOSIS — I10 Essential (primary) hypertension: Secondary | ICD-10-CM | POA: Diagnosis not present

## 2024-02-07 DIAGNOSIS — E782 Mixed hyperlipidemia: Secondary | ICD-10-CM

## 2024-02-07 NOTE — Progress Notes (Signed)
 "  Office Visit    Patient Name: Kristen Merritt Date of Encounter: 02/07/2024  Primary Care Provider:  Regino Slater, MD Primary Cardiologist:  Dorn Lesches, MD  Chief Complaint    79 year old female with a history of palpitations, PSVT, PVCs, mitral valve regurgitation, aortic insufficiency, hypertension, and hyperlipidemia who presents for follow-up related to palpitations.  Past Medical History    Past Medical History:  Diagnosis Date   Hypertension    Nodal paroxysmal tachycardia    Palpitations    Paroxysmal supraventricular tachycardia    Pure hypercholesterolemia    Past Surgical History:  Procedure Laterality Date   TONSILLECTOMY     TUBAL LIGATION      Allergies  Allergies[1]   Labs/Other Studies Reviewed    The following studies were reviewed today:  Cardiac Studies & Procedures   ______________________________________________________________________________________________   STRESS TESTS  NM MYOCAR MULTI W/SPECT W 04/16/2003   ECHOCARDIOGRAM  ECHOCARDIOGRAM COMPLETE 04/27/2023  Narrative ECHOCARDIOGRAM REPORT    Patient Name:   Kristen Merritt [facility] Date of Exam: 04/27/2023 Medical Rec #:  [id number]     Height:       61.0 in Accession #:    [id number]    Weight:       135.0 lb Date of Birth:  Jan 05, 1946     BSA:          1.598 m Patient Age:    77 years      BP:           156/93 mmHg Patient Gender: F             HR:           55 bpm. Exam Location:  [address]  Procedure: 2D Echo, 3D Echo, Cardiac Doppler, Color Doppler and Strain Analysis (Both Spectral and Color Flow Doppler were utilized during procedure).  Indications:     I47.19 SVT  History:         Patient has no prior history of Echocardiogram examinations. Signs/Symptoms:Palpitations.  Sonographer:     Waldo Guadalajara RCS Referring Phys:  [phone] SLATER REGINO Diagnosing Phys: [place]  IMPRESSIONS   1. GLS is borderline. Left ventricular ejection fraction, by estimation, is  50 to 55%. The left ventricle has low normal function. The left ventricle has no regional wall motion abnormalities. Left ventricular diastolic parameters are consistent with Grade I diastolic dysfunction (impaired relaxation). The average left ventricular global longitudinal strain is -17.2 %. 2. Right ventricular systolic function is normal. The right ventricular size is normal. There is normal pulmonary artery systolic pressure. 3. The mitral valve is normal in structure. Mild mitral valve regurgitation. 4. The aortic valve is tricuspid. Aortic valve regurgitation is mild to moderate. 5. The inferior vena cava is normal in size with greater than 50% respiratory variability, suggesting right atrial pressure of 3 mmHg.  FINDINGS Left Ventricle: GLS is borderline. Left ventricular ejection fraction, by estimation, is 50 to 55%. The left ventricle has low normal function. The left ventricle has no regional wall motion abnormalities. The average left ventricular global longitudinal strain is -17.2 %. The left ventricular internal cavity size was normal in size. There is no left ventricular hypertrophy. Left ventricular diastolic parameters are consistent with Grade I diastolic dysfunction (impaired relaxation).  Right Ventricle: The right ventricular size is normal. Right ventricular systolic function is normal. There is normal pulmonary artery systolic pressure. The tricuspid regurgitant velocity is 1.96 m/s, and with an assumed right atrial pressure of  3 mmHg, the estimated right ventricular systolic pressure is 18.4 mmHg.  Left Atrium: Left atrial size was normal in size.  Right Atrium: Right atrial size was normal in size.  Pericardium: There is no evidence of pericardial effusion.  Mitral Valve: The mitral valve is normal in structure. Mild mitral valve regurgitation.  Tricuspid Valve: Tricuspid valve regurgitation is trivial.  Aortic Valve: The aortic valve is tricuspid. Aortic valve  regurgitation is mild to moderate. Aortic regurgitation PHT measures 1041 msec.  Aorta: The aortic root and ascending aorta are structurally normal, with no evidence of dilitation.  Venous: The inferior vena cava is normal in size with greater than 50% respiratory variability, suggesting right atrial pressure of 3 mmHg.  Additional Comments: 3D was performed not requiring image post processing on an independent workstation and was normal.   LEFT VENTRICLE PLAX 2D LVIDd:         4.80 cm   Diastology LVIDs:         3.50 cm   LV e' medial:    9.36 cm/s LV PW:         0.60 cm   LV E/e' medial:  7.0 LV IVS:        0.60 cm   LV e' lateral:   13.40 cm/s LVOT diam:     1.80 cm   LV E/e' lateral: 4.9 LV SV:         57 LV SV Index:   36        2D Longitudinal Strain LVOT Area:     2.54 cm  2D Strain GLS (A4C):   -18.8 % 2D Strain GLS (A3C):   -16.7 % 2D Strain GLS (A2C):   -16.2 % 2D Strain GLS Avg:     -17.2 %  3D Volume EF: 3D EF:        58 % LV EDV:       110 ml LV ESV:       46 ml LV SV:        64 ml  RIGHT VENTRICLE RV Basal diam:  2.70 cm RV S prime:     12.10 cm/s TAPSE (M-mode): 1.9 cm RVSP:           18.4 mmHg  LEFT ATRIUM             Index        RIGHT ATRIUM           Index LA diam:        2.60 cm 1.63 cm/m   RA Pressure: 3.00 mmHg LA Vol (A2C):   46.8 ml 29.28 ml/m  RA Area:     8.88 cm LA Vol (A4C):   28.3 ml 17.71 ml/m  RA Volume:   19.70 ml  12.33 ml/m LA Biplane Vol: 37.6 ml 23.53 ml/m AORTIC VALVE LVOT Vmax:   97.40 cm/s LVOT Vmean:  64.000 cm/s LVOT VTI:    0.223 m AI PHT:      1041 msec  AORTA Ao Root diam: 2.80 cm Ao Asc diam:  3.00 cm  MITRAL VALVE               TRICUSPID VALVE MV Area (PHT):             TR Peak grad:   15.4 mmHg MV Decel Time:             TR Vmax:        196.00 cm/s MV E velocity: 65.50 cm/s  Estimated RAP:  3.00 mmHg MV A velocity: 72.80 cm/s  RVSP:           18.4 mmHg MV E/A ratio:  0.90 SHUNTS Systemic VTI:  0.22  m Systemic Diam: 1.80 cm  Ronal Ross Electronically signed by Ronal Ross Signature Date/Time: 04/27/2023/4:12:45 PM    Final (Updated)    MONITORS  LONG TERM MONITOR (3-14 DAYS) 08/07/2023  Narrative Patch Wear Time:  6 days and 14 hours (2025-07-06T15:09:20-0400 to 2025-07-13T05:17:20-0400)  Patient had a min HR of 47 bpm, max HR of 125 bpm, and avg HR of 63 bpm. Predominant underlying rhythm was Sinus Rhythm. 1 run of Supraventricular Tachycardia occurred lasting 7 beats with a max rate of 125 bpm (avg 93 bpm). Isolated SVEs were rare (<1.0%), SVE Couplets were rare (<1.0%), and SVE Triplets were rare (<1.0%). Isolated VEs were rare (<1.0%, 4609), VE Couplets were rare (<1.0%, 901), and VE Triplets were rare (<1.0%, 74). Ventricular Bigeminy and Trigeminy were present.  SR/SB/ST Short runs of SVT Occasional PACs/PVCs   CT SCANS  CT CARDIAC SCORING (SELF PAY ONLY) 08/02/2023  Addendum 08/04/2023  5:03 PM ADDENDUM REPORT: 08/04/2023 17:00  EXAM: OVER-READ INTERPRETATION  CT CHEST  The following report is an over-read performed by radiologist Dr. Andrea Gasman of [facility] on 08/04/2023. This over-read does not include interpretation of cardiac or coronary anatomy or pathology. The coronary calcium score interpretation by the cardiologist is attached.  COMPARISON:  None.  FINDINGS: Vascular: Mild aortic atherosclerosis. The included aorta is normal in caliber.  Mediastinum/nodes: No adenopathy or mass. Unremarkable esophagus.  Lungs: No focal airspace disease. 5 mm subpleural right lower lobe nodule series 2, image 49. Small perifissural lymph node in the right lower lobe series 2, image 38. No pleural fluid. The included airways are patent.  Upper abdomen: No acute or unexpected findings.  Musculoskeletal: There are no acute or suspicious osseous abnormalities.  IMPRESSION: 1. Right lower lobe pulmonary nodule measuring 5 mm. Per  Fleischner Society Guidelines, no routine follow-up imaging is recommended. These guidelines do not apply to immunocompromised patients and patients with cancer. Follow up in patients with significant comorbidities as clinically warranted. For lung cancer screening, adhere to Lung-RADS guidelines. Reference: Radiology. 2017; 284(1):228-43. 2.  Aortic Atherosclerosis (ICD10-I70.0).   Electronically Signed By: Andrea Gasman M.D. On: 08/04/2023 17:00  Narrative CLINICAL DATA:  Cardiovascular Disease Risk stratification  EXAM: Coronary Calcium Score  TECHNIQUE: A gated, non-contrast computed tomography scan of the heart was performed using 3 mm slice thickness. Axial images were analyzed on a dedicated workstation. Calcium scoring of the coronary arteries was performed using the Agatston method.  FINDINGS: Coronary Calcium Score:  Total Score: 0  Pericardium: Normal.  Ascending Aorta: 32 mm at the mid ascending aorta measured in an axial plane.  IMPRESSION: 1. Coronary calcium score of 0. This was 1st percentile for age-, race-, and sex-matched controls.  2. Non-cardiac: See separate report from [facility].  RECOMMENDATIONS: Coronary artery calcium (CAC) score is a strong predictor of incident coronary heart disease (CHD) and provides predictive information beyond traditional risk factors. CAC scoring is reasonable to use in the decision to withhold, postpone, or initiate statin therapy in intermediate-risk or selected borderline-risk asymptomatic adults (age 29-75 years and LDL-C >=70 to <190 mg/dL) who do not have diabetes or established atherosclerotic cardiovascular disease (ASCVD).* In intermediate-risk (10-year ASCVD risk >=7.5% to <20%) adults or selected borderline-risk (10-year ASCVD risk >=5% to <7.5%) adults in whom a CAC score is  measured for the purpose of making a treatment decision the following recommendations have been made:  If CAC=0,  it is reasonable to withhold statin therapy and reassess in 5 to 10 years, as long as higher risk conditions are absent (diabetes mellitus, family history of premature CHD in first degree relatives (males <55 years; females <65 years), cigarette smoking, or LDL >=190 mg/dL).  If CAC is 1 to 99, it is reasonable to initiate statin therapy for patients >=23 years of age.  If CAC is >=100 or >=75th percentile, it is reasonable to initiate statin therapy at any age.  Cardiology referral should be considered for patients with CAC scores >=400 or >=75th percentile.  *2018 AHA/ACC/AACVPR/AAPA/ABC/ACPM/ADA/AGS/APhA/ASPC/NLA/PCNA Guideline on the Management of Blood Cholesterol: A Report of the American College of Cardiology/American Heart Association Task Force on Clinical Practice Guidelines. J Am Coll Cardiol. 2019;73(24):3168-3209.  Madonna Large, DO, [facility]  Electronically Signed: By: Madonna Large On: 08/04/2023 16:22     ______________________________________________________________________________________________     Recent Labs: 04/03/2023: BUN 23; Creatinine, Ser 0.83; Hemoglobin 14.8; Platelets 267; Potassium 4.2; Sodium 139  Recent Lipid Panel No results found for: CHOL, TRIG, HDL, CHOLHDL, VLDL, LDLCALC, LDLDIRECT  History of Present Illness   79 year old female with the above past medical history including palpitations, PSVT, PVCs, mitral valve regurgitation, aortic insufficiency, hypertension, and hyperlipidemia.  Previously evaluated by Dr. Morgan, she later transitioned care to Dr. Court.  She has a longstanding history of palpitations exacerbated by caffeine, chocolate, and stress more managed with beta-blocker therapy.  Echocardiogram in 04/2023 per PCP revealed low normal LV function, EF 50 to 55%, no RWMA, G1 DD, normal RV systolic function, mild mitral valve regurgitation, mild to moderate aortic valve regurgitation.  Repeat echocardiogram was recommended  in 1 year (07/2024).  She was last seen in the office on 07/18/2023 and reported intermittent palpitations, generalized fatigue.  Coronary calcium score in 07/2023 was 0. Cardiac monitor in 07/2023 revealed predominant normal sinus rhythm, 1 run of SVT lasting 7 beats, occasional PACs and PVCs, no significant arrhythmia.  She presents today for follow-up accompanied by her daughter. Since her last visit she has done well from a cardiac standpoint.  She has occasional palpitations, no associated symptoms.  Her BP has been well-controlled.  She exercises regularly.  She denies any chest pain, dyspnea, edema, PND, orthopnea, weight gain.  Overall, she reports feeling well.  Home Medications    Current Outpatient Medications  Medication Sig Dispense Refill   atenolol (TENORMIN) 25 MG tablet Take 25 mg by mouth daily.     cholecalciferol (VITAMIN D3) 25 MCG (1000 UNIT) tablet Take 1,000 Units by mouth daily.     No current facility-administered medications for this visit.     Review of Systems    She denies chest pain, dyspnea, pnd, orthopnea, n, v, dizziness, syncope, edema, weight gain, or early satiety. All other systems reviewed and are otherwise negative except as noted above.   Physical Exam    VS:  BP 126/80 (Cuff Size: Normal)   Pulse 72   Ht 5' 1.5 (1.562 m)   Wt 141 lb (64 kg)   LMP  (LMP Unknown)   SpO2 98%   BMI 26.21 kg/m   GEN: Well nourished, well developed, in no acute distress. HEENT: normal. Neck: Supple, no JVD, carotid bruits, or masses. Cardiac: RRR, no murmurs, rubs, or gallops. No clubbing, cyanosis, edema.  Radials/DP/PT 2+ and equal bilaterally.  Respiratory:  Respirations regular and unlabored, clear to auscultation  bilaterally. GI: Soft, nontender, nondistended, BS + x 4. MS: no deformity or atrophy. Skin: warm and dry, no rash. Neuro:  Strength and sensation are intact. Psych: Normal affect.  Accessory Clinical Findings    ECG personally reviewed by me  today -    - no EKG in office today.    Lab Results  Component Value Date   WBC 5.7 04/03/2023   HGB 14.8 04/03/2023   HCT 45.5 04/03/2023   MCV 86.2 04/03/2023   PLT 267 04/03/2023   Lab Results  Component Value Date   CREATININE 0.83 04/03/2023   BUN 23 04/03/2023   NA 139 04/03/2023   K 4.2 04/03/2023   CL 104 04/03/2023   CO2 27 04/03/2023   No results found for: ALT, AST, GGT, ALKPHOS, BILITOT No results found for: CHOL, HDL, LDLCALC, LDLDIRECT, TRIG, CHOLHDL  No results found for: HGBA1C  Assessment & Plan    1. Palpitations/PSVT/PVCs: Longstanding history of palpitations exacerbated by caffeine, stress, chocolate. Cardiac monitor in 07/2023 revealed predominant normal sinus rhythm, 1 run of SVT lasting 7 beats, occasional PACs and PVCs, no significant arrhythmia.  She notes occasional palpitations, no associated symptoms. She generally avoids triggers.  Continue atenolol.  2. Valvular heart disease: Echocardiogram in 04/2023 per PCP revealed low normal LV function, EF 50 to 55%, no RWMA, G1 DD, normal RV systolic function, mild mitral valve regurgitation, mild to moderate aortic valve regurgitation. Asymptomatic, euvolemic and well compensated on exam. Repeat echocardiogram was recommended in 1 year (scheduled for 07/2024).   3. Hypertension: BP well controlled. Continue current antihypertensive regimen.   4. Hyperlipidemia: LDL was 162 in 06/2023. Coronary calcium score in 7/25 was 0. Not on statin therapy. Continue lifestyle modifications with diet and exercise.  5. Disposition: Follow-up in 6 months, sooner if needed.       Damien JAYSON Braver, NP 02/07/2024, 2:07 PM       [1]  Allergies Allergen Reactions   Lodine [Etodolac]     rash   Mavik [Trandolapril]    Sulfa Antibiotics     Rash    Micardis [Telmisartan] Rash   "

## 2024-02-07 NOTE — Patient Instructions (Signed)
 Medication Instructions:  Your physician recommends that you continue on your current medications as directed. Please refer to the Current Medication list given to you today.  *If you need a refill on your cardiac medications before your next appointment, please call your pharmacy*  Lab Work: NONE ordered at this time of appointment   Testing/Procedures: NONE ordered at this time of appointment   Follow-Up: At [facility], you and your health needs are our priority.  As part of our continuing mission to provide you with exceptional heart care, our providers are all part of one team.  This team includes your primary Cardiologist (physician) and Advanced Practice Providers or APPs (Physician Assistants and Nurse Practitioners) who all work together to provide you with the care you need, when you need it.  Your next appointment:   6 month(s)  Provider:   Lauro Portal, MD    We recommend signing up for the patient portal called "MyChart".  Sign up information is provided on this After Visit Summary.  MyChart is used to connect with patients for Virtual Visits (Telemedicine).  Patients are able to view lab/test results, encounter notes, upcoming appointments, etc.  Non-urgent messages can be sent to your provider as well.   To learn more about what you can do with MyChart, go to [email or web address].

## 2024-07-17 ENCOUNTER — Other Ambulatory Visit (HOSPITAL_COMMUNITY)
